# Patient Record
Sex: Female | Born: 1980 | Race: Black or African American | Hispanic: No | Marital: Single | Smoking: Never smoker
Health system: Southern US, Community
[De-identification: ages and names within clinical notes are randomized; demographics above are authoritative.]

## PROBLEM LIST (undated history)

## (undated) DIAGNOSIS — B009 Herpesviral infection, unspecified: Secondary | ICD-10-CM

## (undated) DIAGNOSIS — A599 Trichomoniasis, unspecified: Secondary | ICD-10-CM

## (undated) DIAGNOSIS — Z789 Other specified health status: Secondary | ICD-10-CM

## (undated) HISTORY — PX: DENTAL SURGERY: SHX609

---
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
## 2003-06-22 ENCOUNTER — Emergency Department (HOSPITAL_COMMUNITY): Admission: EM | Admit: 2003-06-22 | Discharge: 2003-06-22 | Payer: Self-pay | Admitting: Emergency Medicine

## 2003-10-02 ENCOUNTER — Inpatient Hospital Stay (HOSPITAL_COMMUNITY): Admission: AD | Admit: 2003-10-02 | Discharge: 2003-10-02 | Payer: Self-pay | Admitting: Obstetrics and Gynecology

## 2003-10-05 ENCOUNTER — Inpatient Hospital Stay (HOSPITAL_COMMUNITY): Admission: AD | Admit: 2003-10-05 | Discharge: 2003-10-05 | Payer: Self-pay | Admitting: Family Medicine

## 2003-12-29 ENCOUNTER — Inpatient Hospital Stay (HOSPITAL_COMMUNITY): Admission: AD | Admit: 2003-12-29 | Discharge: 2003-12-29 | Payer: Self-pay | Admitting: Obstetrics & Gynecology

## 2004-04-21 ENCOUNTER — Inpatient Hospital Stay (HOSPITAL_COMMUNITY): Admission: AD | Admit: 2004-04-21 | Discharge: 2004-04-24 | Payer: Self-pay | Admitting: Obstetrics

## 2005-02-23 ENCOUNTER — Emergency Department (HOSPITAL_COMMUNITY): Admission: EM | Admit: 2005-02-23 | Discharge: 2005-02-23 | Payer: Self-pay | Admitting: Family Medicine

## 2005-04-15 ENCOUNTER — Emergency Department (HOSPITAL_COMMUNITY): Admission: EM | Admit: 2005-04-15 | Discharge: 2005-04-15 | Payer: Self-pay | Admitting: Emergency Medicine

## 2005-07-07 ENCOUNTER — Inpatient Hospital Stay (HOSPITAL_COMMUNITY): Admission: AD | Admit: 2005-07-07 | Discharge: 2005-07-07 | Payer: Self-pay | Admitting: Obstetrics & Gynecology

## 2005-07-08 ENCOUNTER — Inpatient Hospital Stay (HOSPITAL_COMMUNITY): Admission: AD | Admit: 2005-07-08 | Discharge: 2005-07-08 | Payer: Self-pay | Admitting: Obstetrics & Gynecology

## 2006-07-01 IMAGING — CR DG ABDOMEN 1V
1 series · 1 of 1 positions shown · non-contrast
Comparison: none

CLINICAL DATA: Right sided abdominal pain for one day.  No vomiting or diarrhea.  
 ABDOMEN ? 1 VIEW:

[view not recorded]
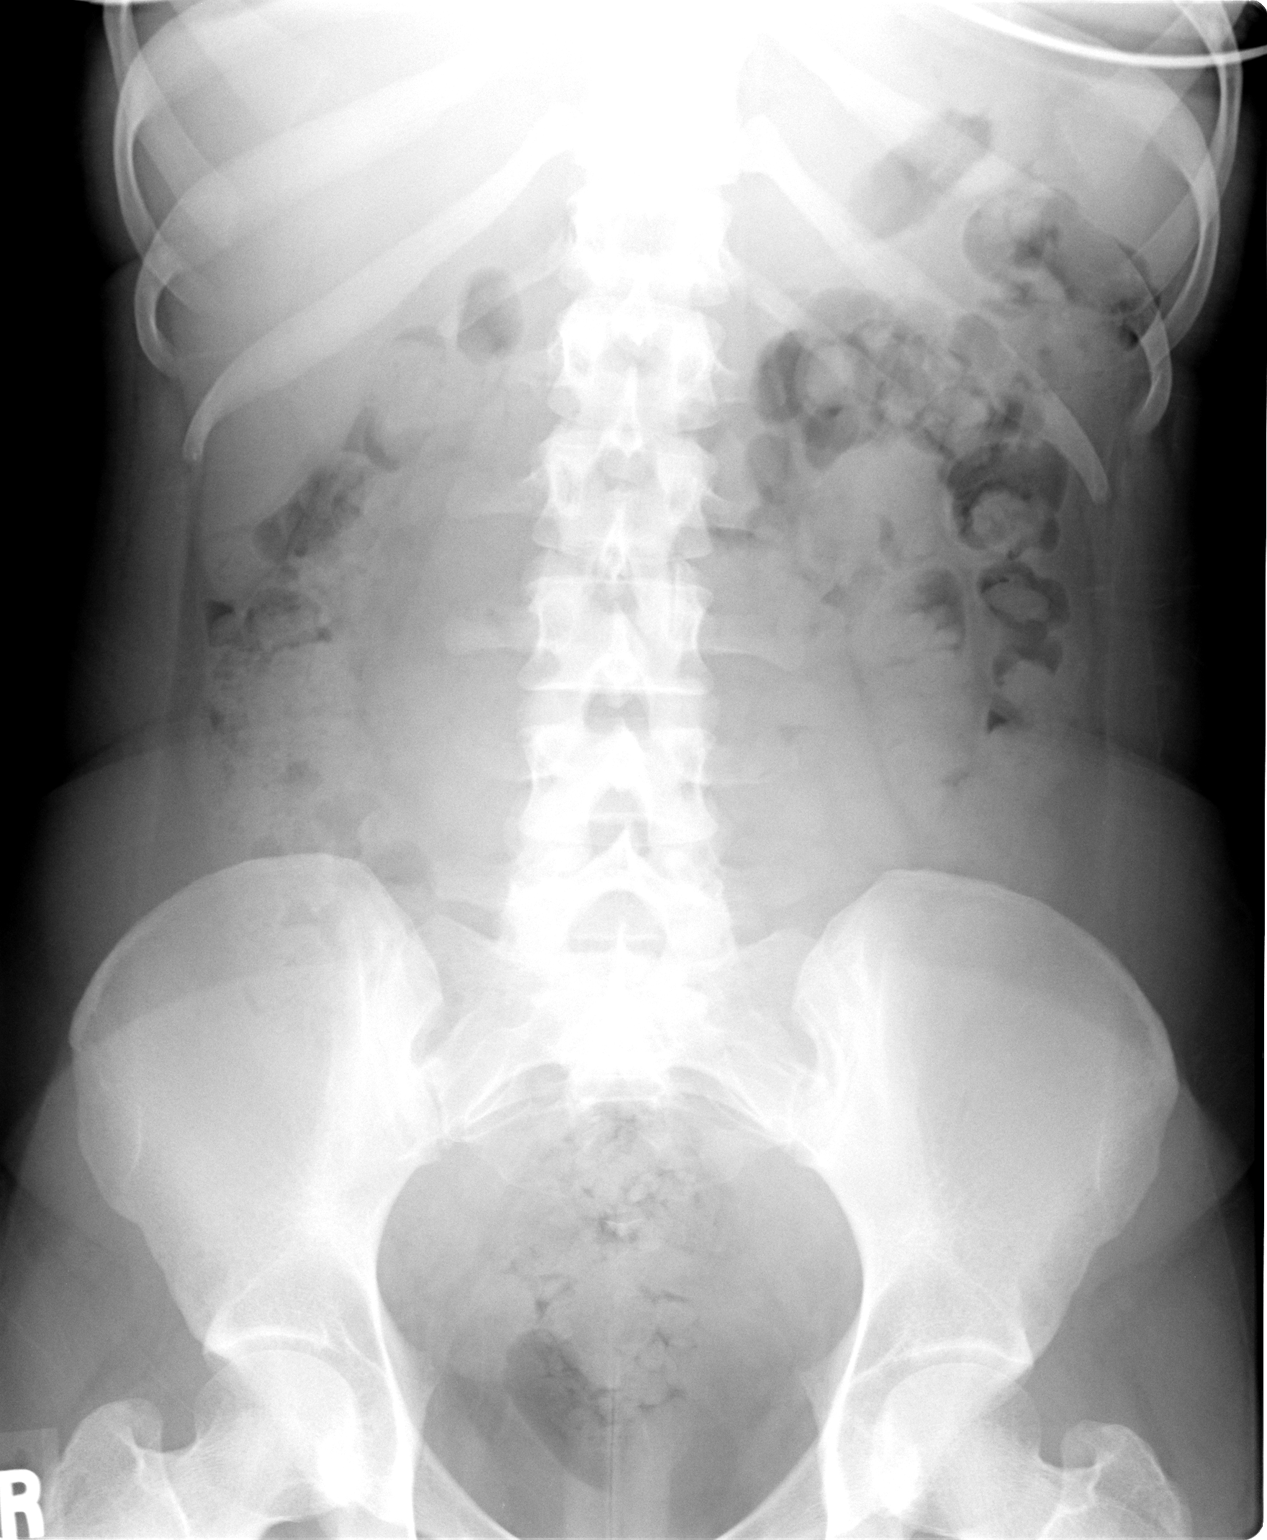

[1 of 1 positions shown; findings below may reference images not displayed]

FINDINGS: A single view of the abdomen shows a moderate amount of fecal material within the right and left colon.  There also appears to be a possible fecal impaction in the region of the rectum.  No evidence of obstruction of the bowel is seen.  No abnormal calcification or mass identified.  The bones of the pelvis and lumbar spine appear normal.
IMPRESSION: Moderate increase fecal material right and left colon.  Questionable fecal impaction of rectum.

## 2006-09-22 IMAGING — US US OB COMP LESS 14 WK
1 series · 14 of 28 positions shown · non-contrast
Comparison: none

CLINICAL DATA: Nausea and vomiting with vaginal bleeding.  
 OBSTETRICAL ULTRASOUND <14 WKS AND TRANSVAGINAL OB US:
TECHNIQUE: Both transabdominal and transvaginal ultrasound examinations were performed for complete evaluation of the gestation as well as the maternal uterus, adnexal regions, and pelvic cul-de-sac.

[Series 1: us ob comp less 14 wk · 0.22mm/px · 14 of 43 slices shown]
[im 2/43]
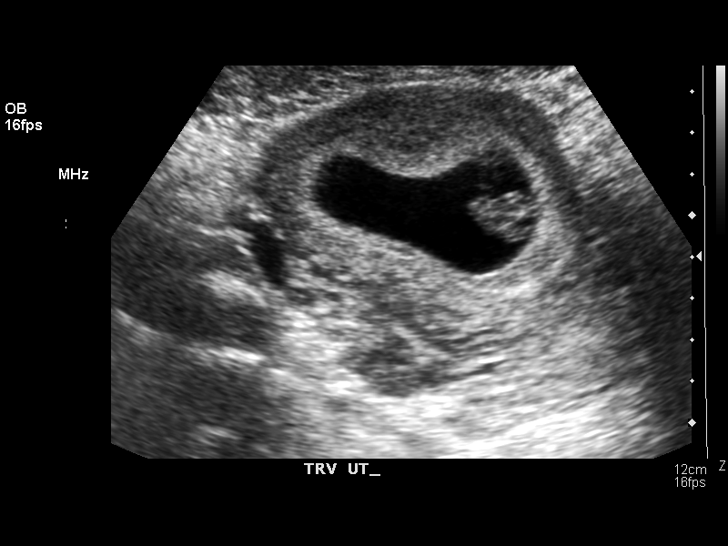
[im 5/43]
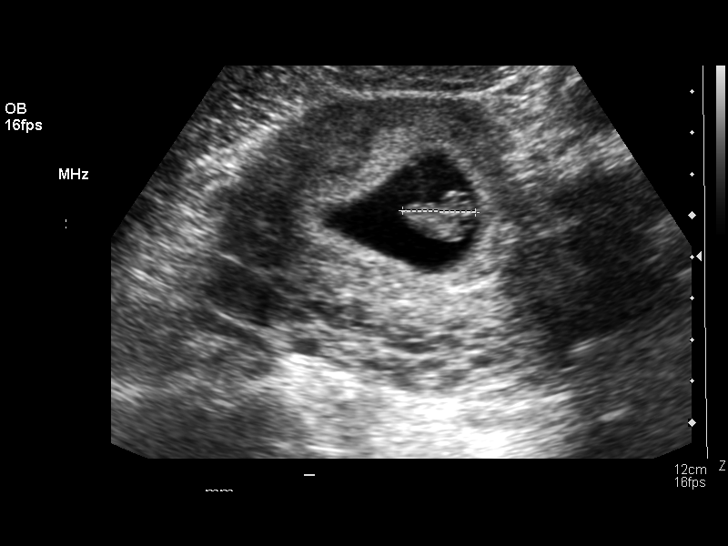
[im 8/43]
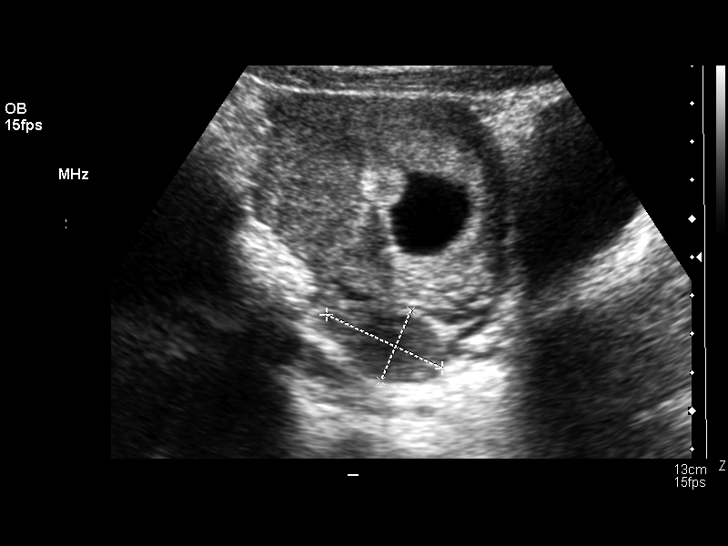
[im 11/43]
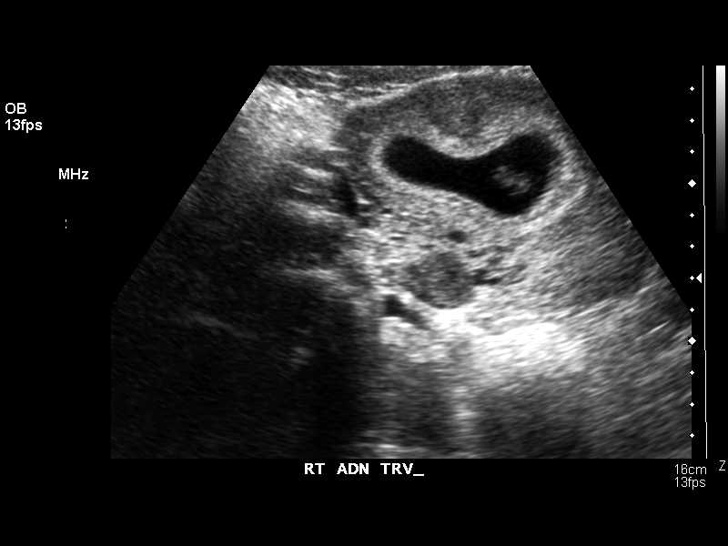
[im 15/43]
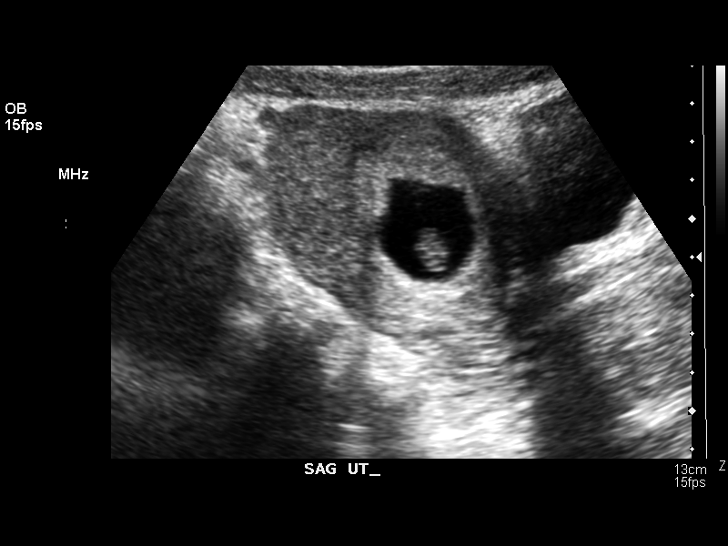
[im 18/43]
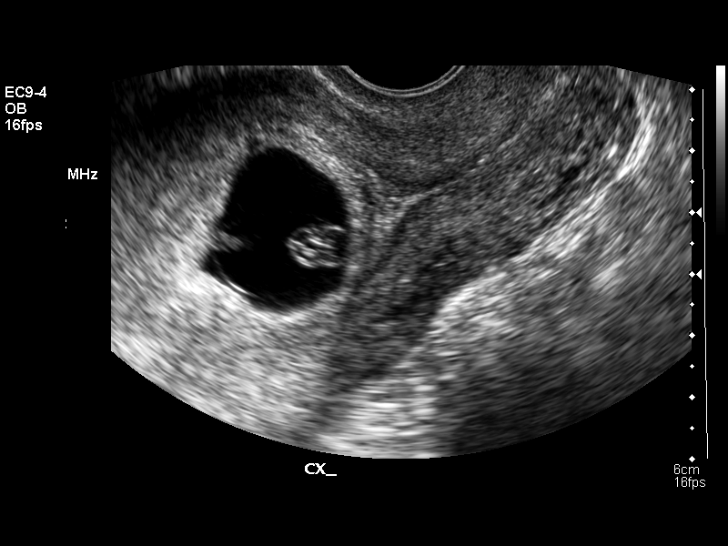
[im 21/43]
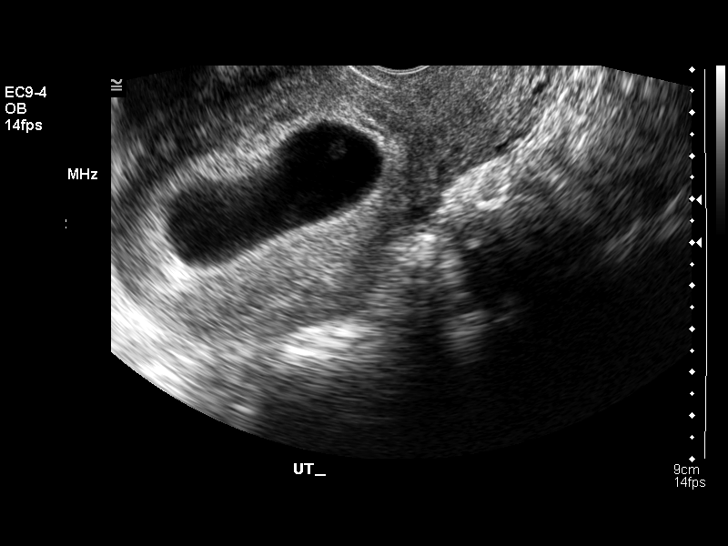
[im 24/43]
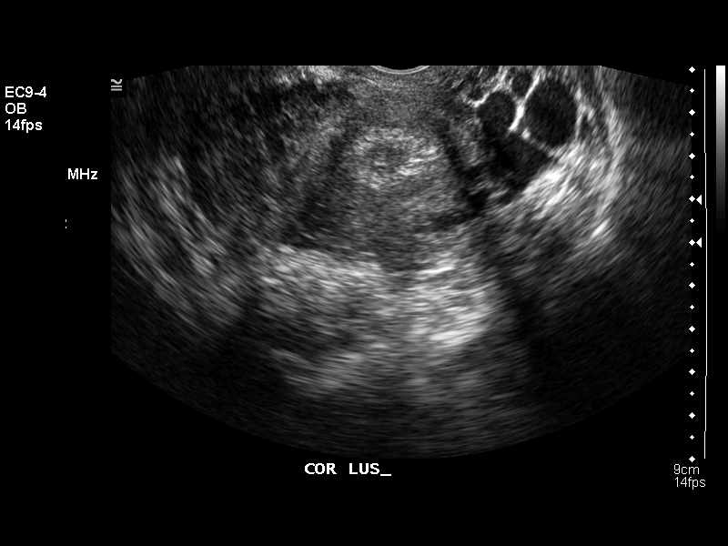
[im 27/43]
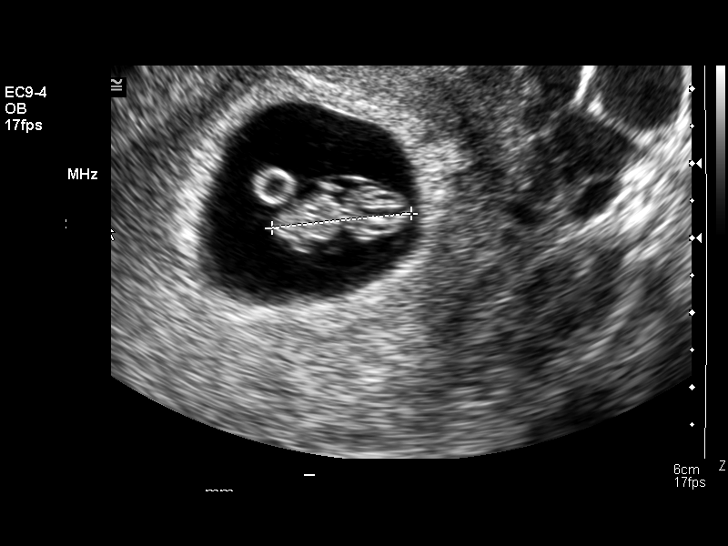
[im 30/43]
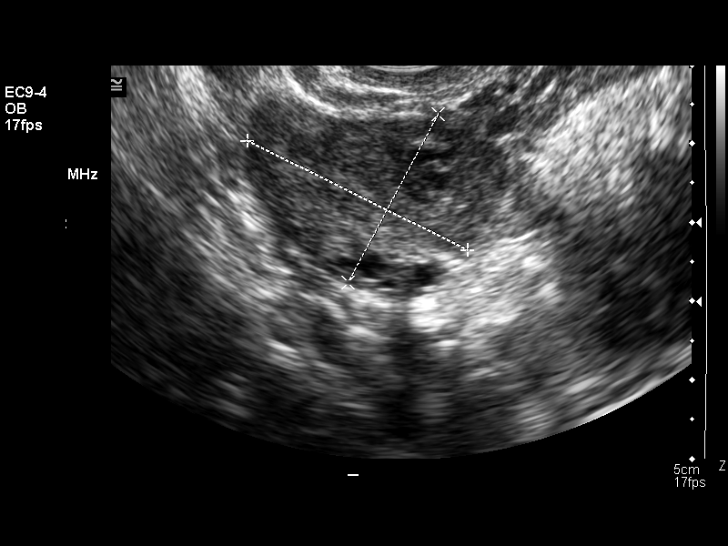
[im 33/43]
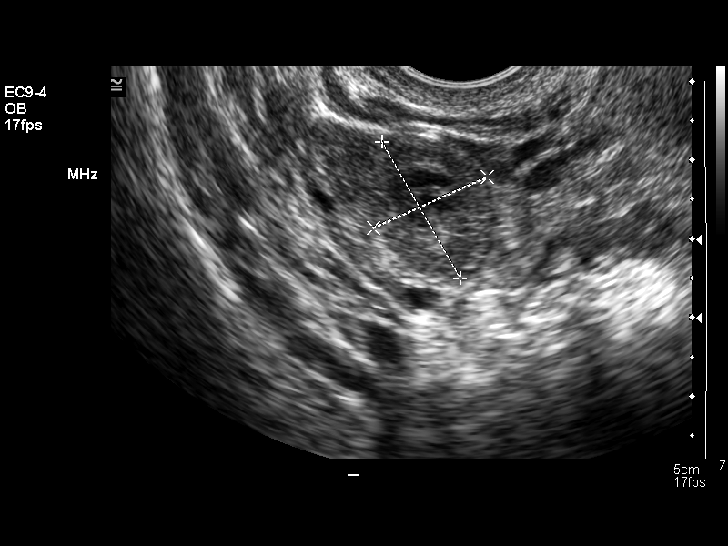
[im 36/43]
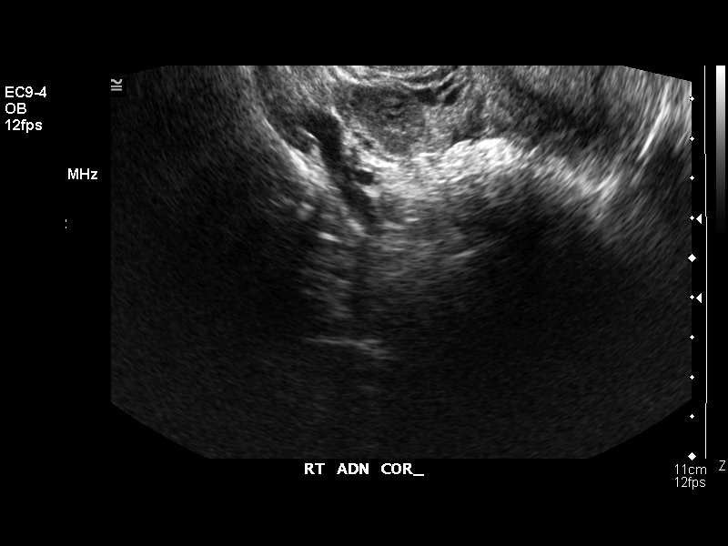
[im 39/43]
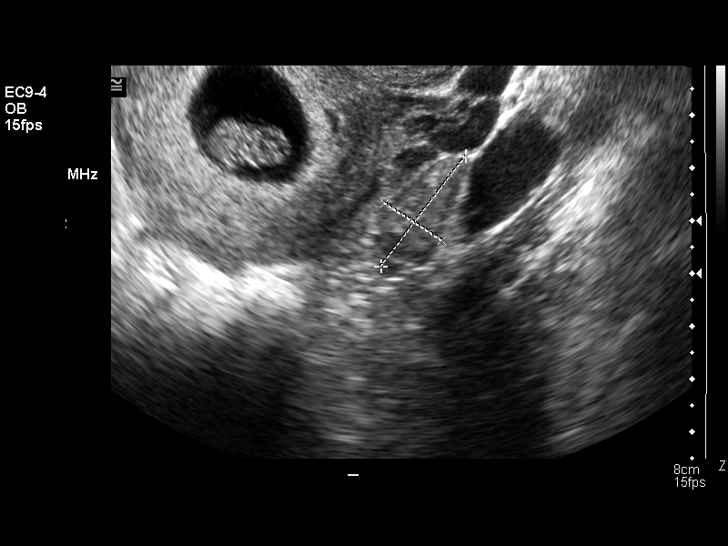
[im 43/43]
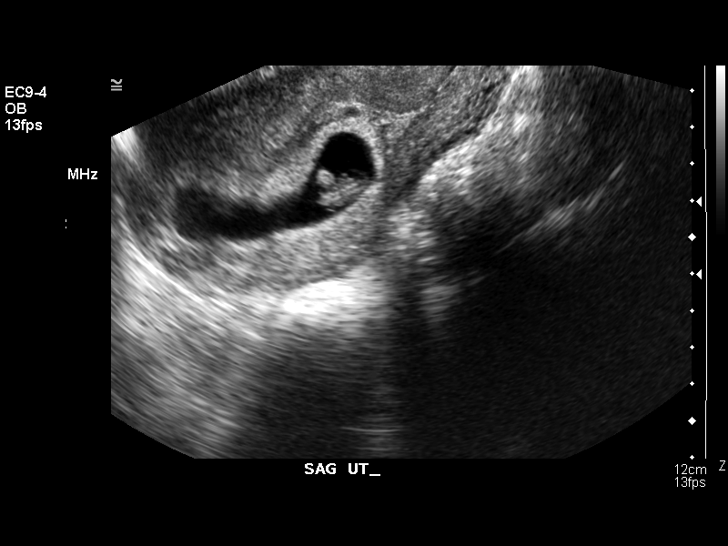

[14 of 28 positions shown; findings below may reference images not displayed]

FINDINGS: Multiple images of the uterus and adnexa were obtained using a transabdominal and endovaginal approaches.
 There is a single intrauterine pregnancy identified that demonstrates an estimated gestational age by crown rump length of 8 weeks and 3 days.  Positive regular fetal cardiac activity with a rate of 175 bpm was noted.  A normal appearing yolk sac and amnion are seen.  No signs of subchorionic hemorrhage are evident.
 The right ovary measures 3.1 x 2.4 x 2.4 cm and contains a corpus luteum cyst.  The left ovary measures 3.6 x 1.3 x 1.5 cm and has a normal appearance.
IMPRESSION: 8 week 3 day living intrauterine pregnancy.  Right corpus luteum cyst.

## 2007-04-05 ENCOUNTER — Inpatient Hospital Stay (HOSPITAL_COMMUNITY): Admission: AD | Admit: 2007-04-05 | Discharge: 2007-04-05 | Payer: Self-pay | Admitting: Obstetrics and Gynecology

## 2007-08-16 ENCOUNTER — Inpatient Hospital Stay (HOSPITAL_COMMUNITY): Admission: AD | Admit: 2007-08-16 | Discharge: 2007-08-16 | Payer: Self-pay | Admitting: Obstetrics and Gynecology

## 2010-08-21 ENCOUNTER — Inpatient Hospital Stay (HOSPITAL_COMMUNITY)
Admission: AD | Admit: 2010-08-21 | Discharge: 2010-08-21 | Disposition: A | Discharge status: Home / Self Care | Payer: 59 | Source: Ambulatory Visit | Attending: Obstetrics & Gynecology | Admitting: Obstetrics & Gynecology

## 2010-08-21 ENCOUNTER — Inpatient Hospital Stay (HOSPITAL_COMMUNITY): Payer: 59

## 2010-08-21 ENCOUNTER — Other Ambulatory Visit: Payer: Self-pay | Admitting: Obstetrics & Gynecology

## 2010-08-21 DIAGNOSIS — O039 Complete or unspecified spontaneous abortion without complication: Secondary | ICD-10-CM

## 2010-08-21 LAB — CBC
HCT: 35.9 % — ABNORMAL LOW (ref 36.0–46.0)
Hemoglobin: 11.8 g/dL — ABNORMAL LOW (ref 12.0–15.0)
MCH: 27.6 pg (ref 26.0–34.0)
MCHC: 32.9 g/dL (ref 30.0–36.0)
MCV: 84.1 fL (ref 78.0–100.0)
Platelets: 181 K/uL (ref 150–400)
RBC: 4.27 MIL/uL (ref 3.87–5.11)
RDW: 12.9 % (ref 11.5–15.5)
WBC: 4.8 K/uL (ref 4.0–10.5)

## 2010-08-21 LAB — HCG, QUANTITATIVE, PREGNANCY

## 2010-08-22 LAB — GC/CHLAMYDIA PROBE AMP, GENITAL
Chlamydia, DNA Probe: NEGATIVE
GC Probe Amp, Genital: NEGATIVE

## 2010-09-02 ENCOUNTER — Encounter: Payer: 59 | Admitting: Physician Assistant

## 2011-01-28 LAB — POCT PREGNANCY, URINE
Operator id: 113551
Preg Test, Ur: POSITIVE

## 2011-01-28 LAB — URINALYSIS, ROUTINE W REFLEX MICROSCOPIC
Ketones, ur: NEGATIVE
Nitrite: NEGATIVE
Protein, ur: NEGATIVE
Urobilinogen, UA: 0.2
pH: 6

## 2011-01-28 LAB — URINE MICROSCOPIC-ADD ON

## 2011-01-28 LAB — GC/CHLAMYDIA PROBE AMP, GENITAL: GC Probe Amp, Genital: NEGATIVE

## 2011-01-28 LAB — WET PREP, GENITAL: Trich, Wet Prep: NONE SEEN

## 2011-01-28 LAB — HEMOGLOBIN AND HEMATOCRIT, BLOOD: HCT: 35.9 — ABNORMAL LOW

## 2011-02-10 LAB — CBC
HCT: 37.9
MCV: 85.6
Platelets: 208
WBC: 6.8

## 2011-02-10 LAB — WET PREP, GENITAL

## 2011-02-10 LAB — GC/CHLAMYDIA PROBE AMP, GENITAL: GC Probe Amp, Genital: NEGATIVE

## 2011-11-06 IMAGING — US US OB COMP LESS 14 WK
1 series · 14 of 28 positions shown · non-contrast
Comparison: none

[Series 1: us ob comp less 14 wks · 14 of 48 slices shown]
[im 2/48]
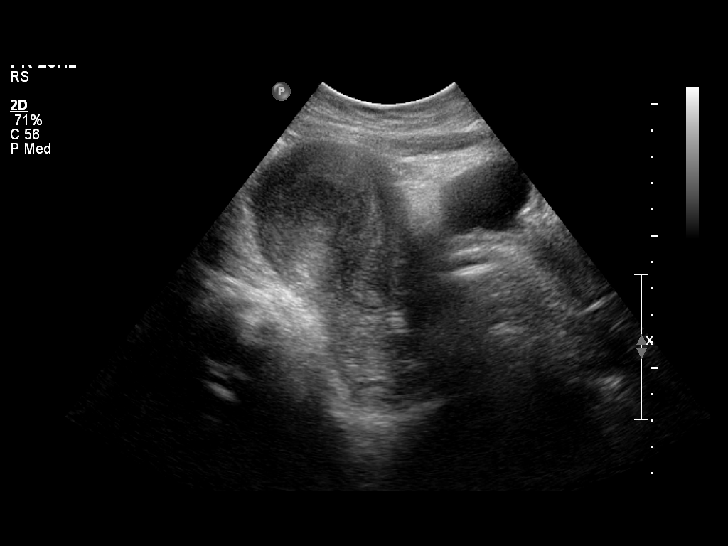
[im 6/48]
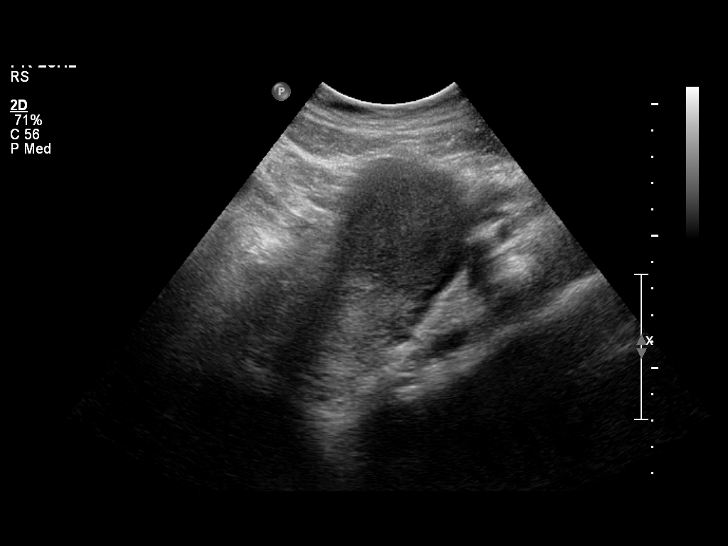
[im 9/48]
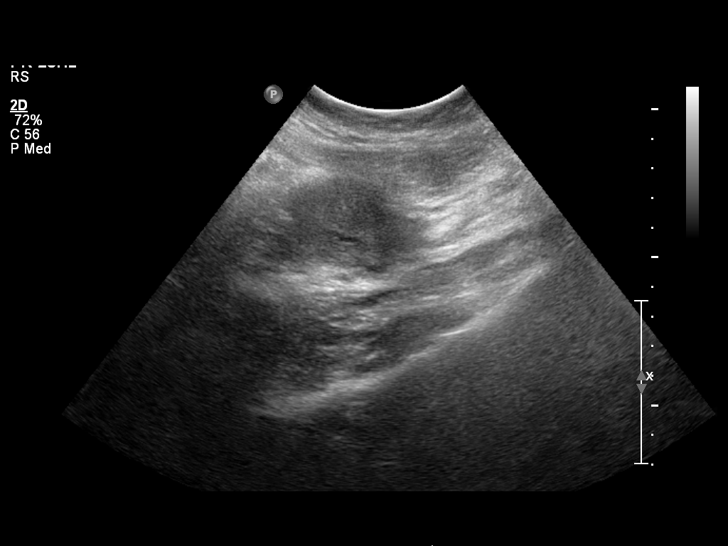
[im 13/48]
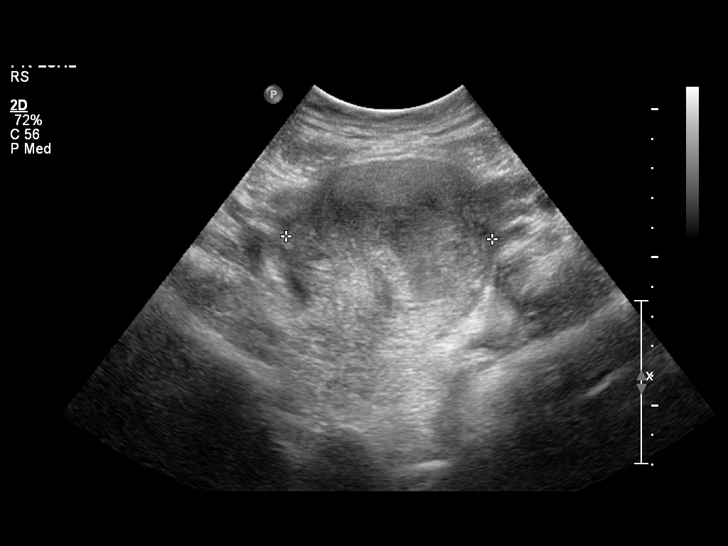
[im 16/48]
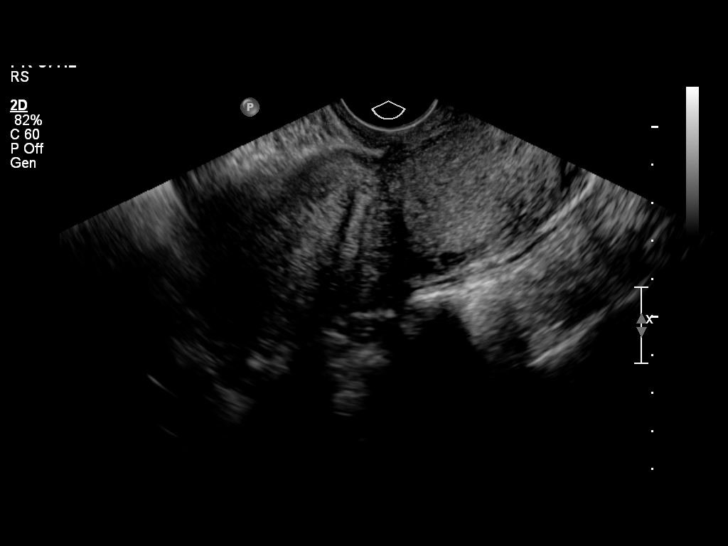
[im 20/48]
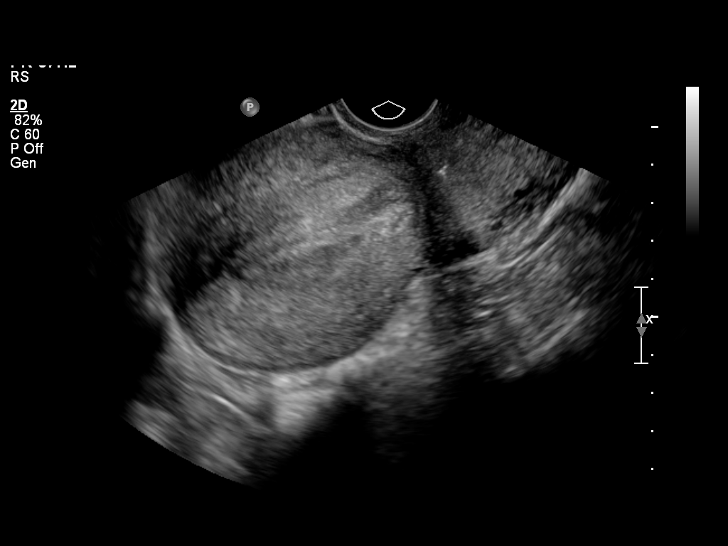
[im 23/48]
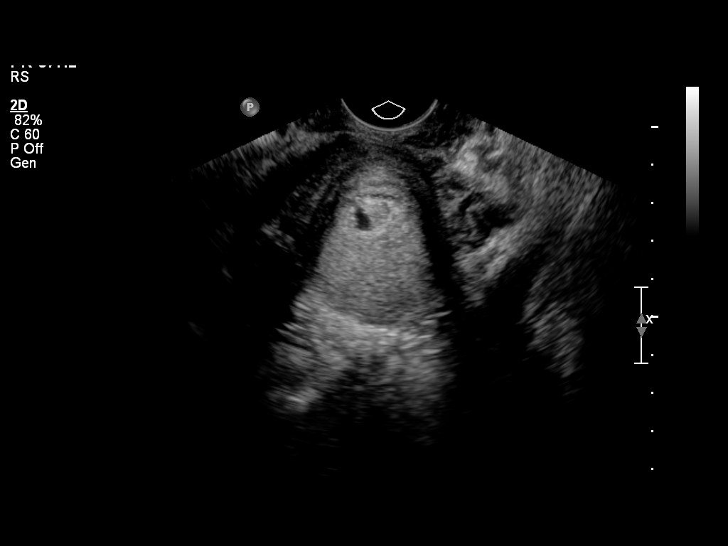
[im 27/48]
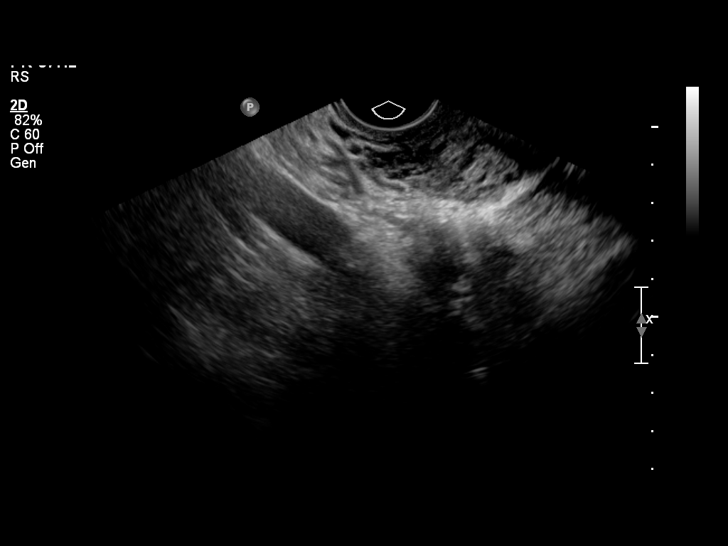
[im 30/48]
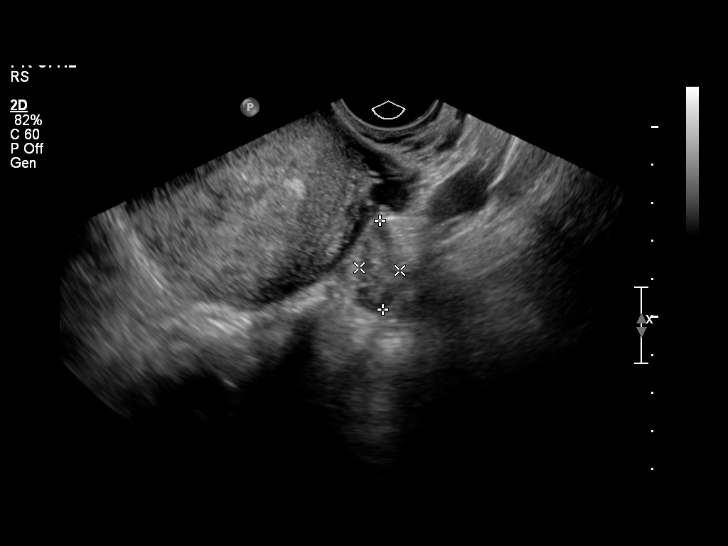
[im 34/48]
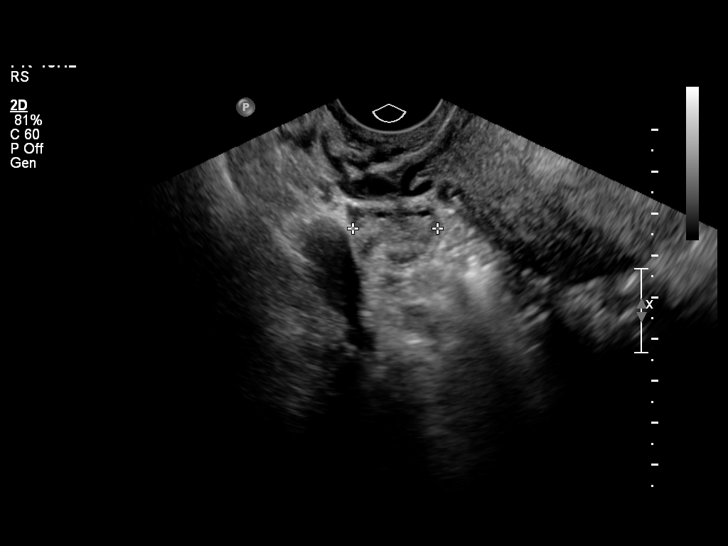
[im 37/48]
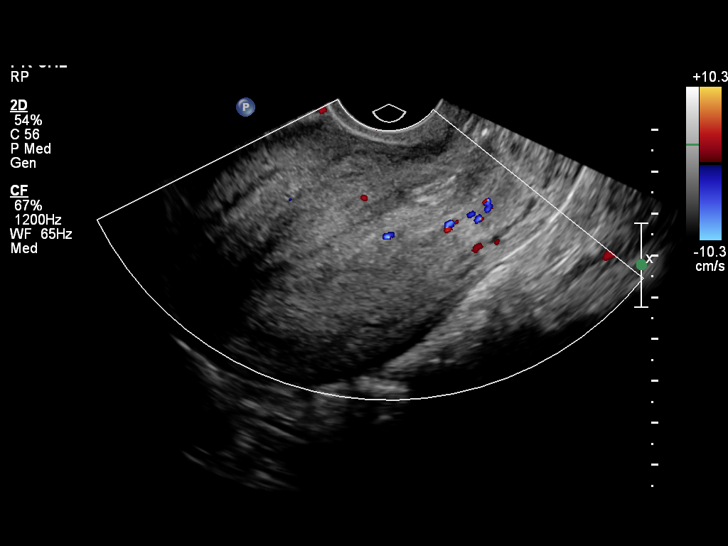
[im 41/48]
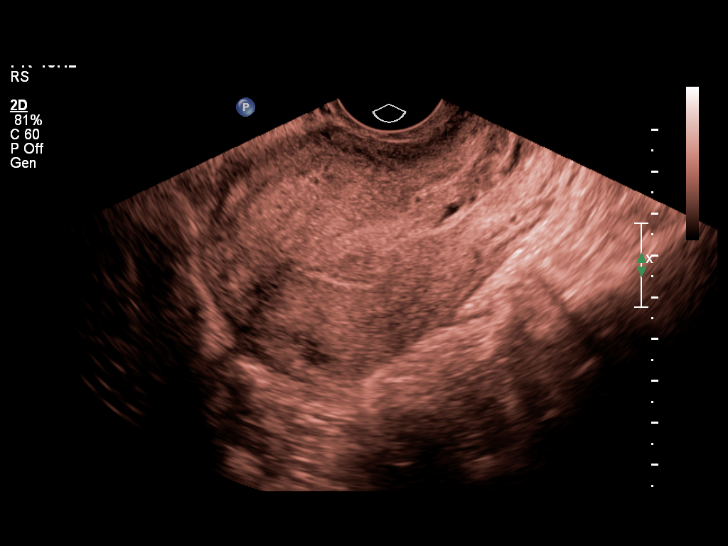
[im 44/48]
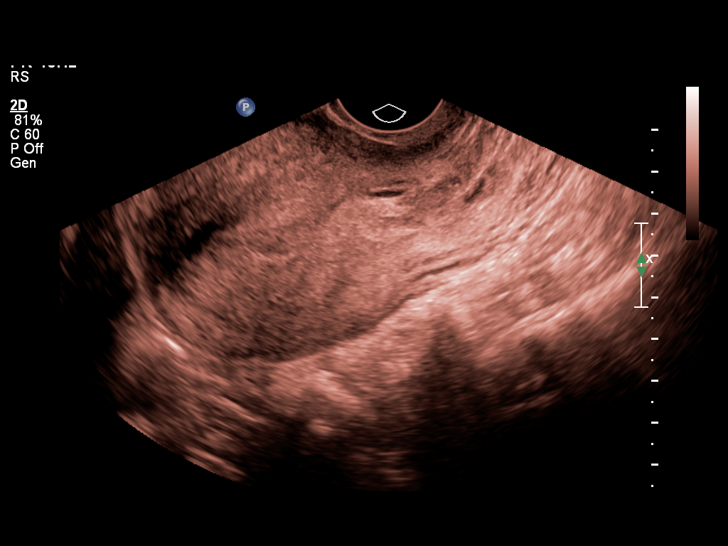
[im 48/48]
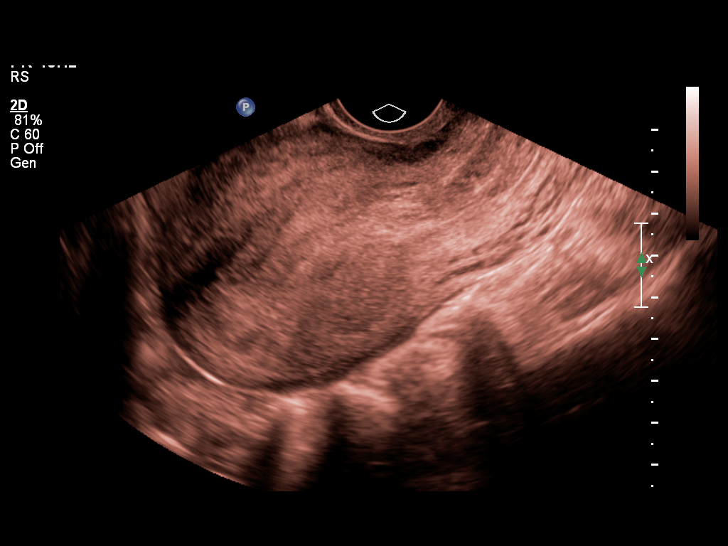

[14 of 28 positions shown; findings below may reference images not displayed]

OBSTETRICS REPORT
                      (Signed Final 08/21/2010 [DATE])

                 28_E
Procedures

 US OB COMP LESS 14 WKS                                76801.0
 US OB TRANSVAGINAL                                    76817.0
Indications

 Vaginal bleeding, unknown etiology
Fetal Evaluation

 Gest. Sac:         None seen
 Yolk Sac:          Not visualized
 Fetal Pole:        Not visualized
 Cardiac Activity:  No embryo visualized
Gestational Age

 LMP:           9w 1d         Date:  06/18/10                 EDD:   03/25/11
 Best:          9w 1d      Det. By:  LMP  (06/18/10)          EDD:   03/25/11
Cervix Uterus Adnexa

 Uterus:       Heterogeneous endometrium measuring 14mm with
               small amount of fluid noted.

 Comment:    Endometrium measures 13.6 mm
Impression

 No IUP or adnexal mass identified.  DDx includes recent
 SAB,  IUP too early to visualize, or occult ectopic pregnancy.
 Recommend close f/u of quantitative BHCG levels, and
 followup US as clinically warranted.

## 2012-04-09 ENCOUNTER — Inpatient Hospital Stay (HOSPITAL_COMMUNITY)
Admission: AD | Admit: 2012-04-09 | Discharge: 2012-04-09 | Disposition: A | Discharge status: Home / Self Care | Payer: Medicaid Other | Source: Ambulatory Visit | Attending: Obstetrics & Gynecology | Admitting: Obstetrics & Gynecology

## 2012-04-09 ENCOUNTER — Encounter (HOSPITAL_COMMUNITY): Payer: Self-pay | Admitting: *Deleted

## 2012-04-09 DIAGNOSIS — B9689 Other specified bacterial agents as the cause of diseases classified elsewhere: Secondary | ICD-10-CM | POA: Insufficient documentation

## 2012-04-09 DIAGNOSIS — L293 Anogenital pruritus, unspecified: Secondary | ICD-10-CM | POA: Insufficient documentation

## 2012-04-09 DIAGNOSIS — A499 Bacterial infection, unspecified: Secondary | ICD-10-CM

## 2012-04-09 DIAGNOSIS — N949 Unspecified condition associated with female genital organs and menstrual cycle: Secondary | ICD-10-CM | POA: Insufficient documentation

## 2012-04-09 DIAGNOSIS — L259 Unspecified contact dermatitis, unspecified cause: Secondary | ICD-10-CM | POA: Insufficient documentation

## 2012-04-09 DIAGNOSIS — N760 Acute vaginitis: Secondary | ICD-10-CM | POA: Insufficient documentation

## 2012-04-09 HISTORY — DX: Trichomoniasis, unspecified: A59.9

## 2012-04-09 LAB — WET PREP, GENITAL: Yeast Wet Prep HPF POC: NONE SEEN

## 2012-04-09 LAB — POCT PREGNANCY, URINE: Preg Test, Ur: NEGATIVE

## 2012-04-09 MED ORDER — METRONIDAZOLE 500 MG PO TABS: 500.0000 mg | ORAL_TABLET | Freq: Two times a day (BID) | ORAL | Status: DC

## 2012-04-09 NOTE — MAU Provider Note (Signed)
  History     CSN: [id number]  Arrival date and time: 04/09/12 1649   First Provider Initiated Contact with Patient 04/09/12 2022      Chief Complaint  Patient presents with  . Vaginal Discharge   HPI Patient states she has had a greenish/yellow vaginal discharge with an odor and itching for several weeks. Has also been having a body rash that she is using hydrocortisone cream but not getting better.     Past Medical History  Diagnosis Date  . Trichimoniasis     Past Surgical History  Procedure Date  . Vaginal delivery   . Dental surgery     History reviewed. No pertinent family history.  History  Substance Use Topics  . Smoking status: Never Smoker   . Smokeless tobacco: Not on file  . Alcohol Use: No    Allergies: No Known Allergies  No prescriptions prior to admission    Review of Systems  Genitourinary:       Vaginal discharge and odor  All other systems reviewed and are negative.   Physical Exam   Blood pressure 127/81, pulse 85, temperature 99.7 F (37.6 C), temperature source Oral, resp. rate 18, height 5\' 7"  (1.702 m), weight 101.696 kg (224 lb 3.2 oz), last menstrual period 03/22/2012, SpO2 100.00%.  Physical Exam  Constitutional: She is oriented to person, place, and time. She appears well-developed and well-nourished. No distress.  HENT:  Head: Normocephalic.  Neck: Normal range of motion. Neck supple.  Cardiovascular: Normal rate and regular rhythm.   Respiratory: Effort normal and breath sounds normal.  GI: Soft. She exhibits mass. There is no tenderness.  Genitourinary: Vaginal discharge (white, creamy) found.       +fishy odor  Neurological: She is alert and oriented to person, place, and time. She has normal reflexes.  Skin: Skin is warm and dry.       Reddened areas on left upper arm; no prominent lesions.    MAU Course  Procedures Results for orders placed during the hospital encounter of 04/09/12 (from the past 24 hour(s))  POCT  PREGNANCY, URINE     Status: Normal   Collection Time   04/09/12  7:04 PM      Component Value Range   Preg Test, Ur NEGATIVE  NEGATIVE  WET PREP, GENITAL     Status: Abnormal   Collection Time   04/09/12  8:30 PM      Component Value Range   Yeast Wet Prep HPF POC NONE SEEN  NONE SEEN   Trich, Wet Prep NONE SEEN  NONE SEEN   Clue Cells Wet Prep HPF POC FEW (*) NONE SEEN   WBC, Wet Prep HPF POC MANY (*) NONE SEEN     Assessment and Plan  Bacterial Vaginosis Contact Dermatitis  Plan: DC to home RX Flagyl Use OTC benadryl cream Consider eval if no improvement    [facility] 04/09/2012, 8:23 PM

## 2012-04-09 NOTE — MAU Note (Signed)
Patient states she has had a greenish/yellow vaginal discharge with an odor and itching for several weeks. Has also been having a body rash that she is using hydrocortisone cream but not getting better.

## 2012-04-12 NOTE — MAU Provider Note (Signed)
Attestation of Attending Supervision of Advanced Practitioner (CNM/NP): Evaluation and management procedures were performed by the Advanced Practitioner under my supervision and collaboration.  I have reviewed the Advanced Practitioner's note and chart, and I agree with the management and plan.  Evia Goldsmith, MD, FACOG Attending Obstetrician & Gynecologist Faculty Practice, [facility]  

## 2013-04-22 LAB — OB RESULTS CONSOLE RUBELLA ANTIBODY, IGM: RUBELLA: IMMUNE

## 2013-04-22 LAB — OB RESULTS CONSOLE HIV ANTIBODY (ROUTINE TESTING): HIV: NONREACTIVE

## 2013-04-22 LAB — OB RESULTS CONSOLE ANTIBODY SCREEN: Antibody Screen: NEGATIVE

## 2013-04-22 LAB — OB RESULTS CONSOLE HEPATITIS B SURFACE ANTIGEN: Hepatitis B Surface Ag: NEGATIVE

## 2013-04-22 LAB — OB RESULTS CONSOLE RPR: RPR: NONREACTIVE

## 2013-05-05 NOTE — L&D Delivery Note (Signed)
Delivery Note At 2:21 PM a viable female was delivered via Vaginal, Spontaneous Delivery (Presentation: Right Occiput Anterior).  APGAR: 8, 9; weight 6 lb 6.8 oz (2915 g).   Placenta status: Intact, Spontaneous.  Cord: 3 vessels with the following complications: None.  Cord pH: NA  Anesthesia: Epidural  Episiotomy: None Lacerations: None Suture Repair: NA Est. Blood Loss (mL): 400  Mom to postpartum.  Baby to Couplet care / Skin to Skin.  Ameria Sanjurjo J. 11/21/2013, 7:01 PM

## 2013-06-03 ENCOUNTER — Other Ambulatory Visit (HOSPITAL_COMMUNITY)
Admission: RE | Admit: 2013-06-03 | Discharge: 2013-06-03 | Disposition: A | Discharge status: Home / Self Care | Payer: BC Managed Care – PPO | Source: Ambulatory Visit | Attending: Obstetrics & Gynecology | Admitting: Obstetrics & Gynecology

## 2013-06-03 ENCOUNTER — Other Ambulatory Visit: Payer: Self-pay | Admitting: Obstetrics & Gynecology

## 2013-06-03 DIAGNOSIS — Z113 Encounter for screening for infections with a predominantly sexual mode of transmission: Secondary | ICD-10-CM | POA: Insufficient documentation

## 2013-06-03 DIAGNOSIS — N760 Acute vaginitis: Secondary | ICD-10-CM | POA: Insufficient documentation

## 2013-06-03 DIAGNOSIS — Z01419 Encounter for gynecological examination (general) (routine) without abnormal findings: Secondary | ICD-10-CM | POA: Insufficient documentation

## 2013-06-03 DIAGNOSIS — Z1151 Encounter for screening for human papillomavirus (HPV): Secondary | ICD-10-CM | POA: Insufficient documentation

## 2013-09-14 LAB — OB RESULTS CONSOLE HIV ANTIBODY (ROUTINE TESTING): HIV: NONREACTIVE

## 2013-10-28 LAB — OB RESULTS CONSOLE GBS: GBS: POSITIVE

## 2013-11-21 ENCOUNTER — Encounter (HOSPITAL_COMMUNITY): Payer: Self-pay | Admitting: *Deleted

## 2013-11-21 ENCOUNTER — Encounter (HOSPITAL_COMMUNITY): Payer: BC Managed Care – PPO | Admitting: Anesthesiology

## 2013-11-21 ENCOUNTER — Inpatient Hospital Stay (HOSPITAL_COMMUNITY)
Admission: AD | Admit: 2013-11-21 | Discharge: 2013-11-23 | DRG: 774 | Disposition: A | Discharge status: Home / Self Care | Payer: BC Managed Care – PPO | Source: Ambulatory Visit | Attending: Obstetrics and Gynecology | Admitting: Obstetrics and Gynecology

## 2013-11-21 ENCOUNTER — Inpatient Hospital Stay (HOSPITAL_COMMUNITY): Payer: BC Managed Care – PPO | Admitting: Anesthesiology

## 2013-11-21 DIAGNOSIS — O9989 Other specified diseases and conditions complicating pregnancy, childbirth and the puerperium: Principal | ICD-10-CM

## 2013-11-21 DIAGNOSIS — O98519 Other viral diseases complicating pregnancy, unspecified trimester: Secondary | ICD-10-CM | POA: Diagnosis present

## 2013-11-21 DIAGNOSIS — Z22330 Carrier of Group B streptococcus: Secondary | ICD-10-CM

## 2013-11-21 DIAGNOSIS — A6000 Herpesviral infection of urogenital system, unspecified: Secondary | ICD-10-CM | POA: Diagnosis present

## 2013-11-21 DIAGNOSIS — O99892 Other specified diseases and conditions complicating childbirth: Principal | ICD-10-CM | POA: Diagnosis present

## 2013-11-21 DIAGNOSIS — IMO0001 Reserved for inherently not codable concepts without codable children: Secondary | ICD-10-CM

## 2013-11-21 HISTORY — DX: Other specified health status: Z78.9

## 2013-11-21 HISTORY — DX: Herpesviral infection, unspecified: B00.9

## 2013-11-21 LAB — COMPREHENSIVE METABOLIC PANEL
ALK PHOS: 87 U/L (ref 39–117)
ALT: 6 U/L (ref 0–35)
AST: 10 U/L (ref 0–37)
Albumin: 2.2 g/dL — ABNORMAL LOW (ref 3.5–5.2)
Anion gap: 12 (ref 5–15)
BUN: 9 mg/dL (ref 6–23)
CHLORIDE: 101 meq/L (ref 96–112)
CO2: 22 meq/L (ref 19–32)
Calcium: 8.7 mg/dL (ref 8.4–10.5)
Creatinine, Ser: 0.65 mg/dL (ref 0.50–1.10)
GFR calc Af Amer: >90 mL/min (ref >90)
GFR calc non Af Amer: >90 mL/min (ref >90)
GLUCOSE: 129 mg/dL — AB (ref 70–99)
POTASSIUM: 3.6 meq/L — AB (ref 3.7–5.3)
SODIUM: 135 meq/L — AB (ref 137–147)
TOTAL PROTEIN: 5.6 g/dL — AB (ref 6.0–8.3)
Total Bilirubin: <0.2 mg/dL — ABNORMAL LOW (ref 0.3–1.2)

## 2013-11-21 LAB — CBC
HCT: 29.4 % — ABNORMAL LOW (ref 36.0–46.0)
HEMATOCRIT: 32.6 % — AB (ref 36.0–46.0)
HEMOGLOBIN: 10.7 g/dL — AB (ref 12.0–15.0)
HEMOGLOBIN: 9.7 g/dL — AB (ref 12.0–15.0)
MCH: 26.4 pg (ref 26.0–34.0)
MCH: 26.6 pg (ref 26.0–34.0)
MCHC: 32.8 g/dL (ref 30.0–36.0)
MCHC: 33 g/dL (ref 30.0–36.0)
MCV: 80.3 fL (ref 78.0–100.0)
MCV: 80.5 fL (ref 78.0–100.0)
Platelets: 151 10*3/uL (ref 150–400)
Platelets: 133 10*3/uL — ABNORMAL LOW (ref 150–400)
RBC: 4.06 MIL/uL (ref 3.87–5.11)
RBC: 3.65 MIL/uL — AB (ref 3.87–5.11)
RDW: 13.3 % (ref 11.5–15.5)
RDW: 13.2 % (ref 11.5–15.5)
WBC: 7.1 10*3/uL (ref 4.0–10.5)
WBC: 11.8 10*3/uL — ABNORMAL HIGH (ref 4.0–10.5)

## 2013-11-21 LAB — RPR

## 2013-11-21 LAB — URIC ACID: Uric Acid, Serum: 5.4 mg/dL (ref 2.4–7.0)

## 2013-11-21 LAB — TYPE AND SCREEN
ABO/RH(D): B POS
Antibody Screen: NEGATIVE

## 2013-11-21 MED ORDER — DIPHENHYDRAMINE HCL 25 MG PO CAPS: 25.0000 mg | ORAL_CAPSULE | Freq: Four times a day (QID) | ORAL | Status: DC | PRN

## 2013-11-21 MED ORDER — LACTATED RINGERS IV SOLN
500.0000 mL | Freq: Once | INTRAVENOUS | Status: AC
  Administered 2013-11-21: 1000 mL via INTRAVENOUS

## 2013-11-21 MED ORDER — ONDANSETRON HCL 4 MG/2ML IJ SOLN: 4.0000 mg | INTRAMUSCULAR | Status: DC | PRN

## 2013-11-21 MED ORDER — FENTANYL 2.5 MCG/ML BUPIVACAINE 1/10 % EPIDURAL INFUSION (WH - ANES): 14.0000 mL/h | INTRAMUSCULAR | Status: DC | PRN

## 2013-11-21 MED ORDER — LIDOCAINE HCL (PF) 1 % IJ SOLN
INTRAMUSCULAR | Status: DC | PRN
  Administered 2013-11-21: 10 mL

## 2013-11-21 MED ORDER — IBUPROFEN 600 MG PO TABS
600.0000 mg | ORAL_TABLET | Freq: Four times a day (QID) | ORAL | Status: DC
  Administered 2013-11-21: 600 mg via ORAL

## 2013-11-21 MED ORDER — EPHEDRINE 5 MG/ML INJ
10.0000 mg | INTRAVENOUS | Status: DC | PRN
  Filled 2013-11-21: qty 2

## 2013-11-21 MED ORDER — PHENYLEPHRINE 40 MCG/ML (10ML) SYRINGE FOR IV PUSH (FOR BLOOD PRESSURE SUPPORT)
80.0000 ug | PREFILLED_SYRINGE | INTRAVENOUS | Status: DC | PRN
  Filled 2013-11-21: qty 2
  Filled 2013-11-21: qty 10

## 2013-11-21 MED ORDER — ONDANSETRON HCL 4 MG/2ML IJ SOLN
4.0000 mg | Freq: Four times a day (QID) | INTRAMUSCULAR | Status: DC | PRN
Start: 2013-11-21 — End: 2013-11-21

## 2013-11-21 MED ORDER — LACTATED RINGERS IV SOLN: INTRAVENOUS | Status: DC

## 2013-11-21 MED ORDER — FENTANYL 2.5 MCG/ML BUPIVACAINE 1/10 % EPIDURAL INFUSION (WH - ANES)
14.0000 mL/h | INTRAMUSCULAR | Status: DC | PRN
  Administered 2013-11-21: 14 mL/h via EPIDURAL
  Filled 2013-11-21: qty 125

## 2013-11-21 MED ORDER — LACTATED RINGERS IV SOLN: 500.0000 mL | INTRAVENOUS | Status: DC | PRN

## 2013-11-21 MED ORDER — WITCH HAZEL-GLYCERIN EX PADS
1.0000 | MEDICATED_PAD | CUTANEOUS | Status: DC | PRN
Start: 2013-11-21 — End: 2013-11-23

## 2013-11-21 MED ORDER — PHENYLEPHRINE 40 MCG/ML (10ML) SYRINGE FOR IV PUSH (FOR BLOOD PRESSURE SUPPORT)
80.0000 ug | PREFILLED_SYRINGE | INTRAVENOUS | Status: DC | PRN
  Filled 2013-11-21: qty 2

## 2013-11-21 MED ORDER — FERROUS SULFATE 325 (65 FE) MG PO TABS
325.0000 mg | ORAL_TABLET | Freq: Two times a day (BID) | ORAL | Status: DC
  Administered 2013-11-22 – 2013-11-23 (×3): 325 mg via ORAL
  Filled 2013-11-21 (×3): qty 1

## 2013-11-21 MED ORDER — LANOLIN HYDROUS EX OINT
TOPICAL_OINTMENT | CUTANEOUS | Status: DC | PRN
Start: 2013-11-21 — End: 2013-11-23

## 2013-11-21 MED ORDER — OXYCODONE-ACETAMINOPHEN 5-325 MG PO TABS
1.0000 | ORAL_TABLET | ORAL | Status: DC | PRN
  Administered 2013-11-21: 1 via ORAL
  Filled 2013-11-21: qty 1

## 2013-11-21 MED ORDER — DIBUCAINE 1 % RE OINT: 1.0000 "application " | TOPICAL_OINTMENT | RECTAL | Status: DC | PRN

## 2013-11-21 MED ORDER — TERBUTALINE SULFATE 1 MG/ML IJ SOLN: 0.2500 mg | Freq: Once | INTRAMUSCULAR | Status: DC | PRN

## 2013-11-21 MED ORDER — DIPHENHYDRAMINE HCL 50 MG/ML IJ SOLN: 12.5000 mg | INTRAMUSCULAR | Status: DC | PRN

## 2013-11-21 MED ORDER — IBUPROFEN 600 MG PO TABS
600.0000 mg | ORAL_TABLET | Freq: Four times a day (QID) | ORAL | Status: DC
  Administered 2013-11-21 – 2013-11-23 (×7): 600 mg via ORAL
  Filled 2013-11-21 (×7): qty 1

## 2013-11-21 MED ORDER — CITRIC ACID-SODIUM CITRATE 334-500 MG/5ML PO SOLN: 30.0000 mL | ORAL | Status: DC | PRN

## 2013-11-21 MED ORDER — FLEET ENEMA 7-19 GM/118ML RE ENEM: 1.0000 | ENEMA | RECTAL | Status: DC | PRN

## 2013-11-21 MED ORDER — LIDOCAINE HCL (PF) 1 % IJ SOLN
30.0000 mL | INTRAMUSCULAR | Status: DC | PRN
  Filled 2013-11-21: qty 30

## 2013-11-21 MED ORDER — PENICILLIN G POTASSIUM 5000000 UNITS IJ SOLR
5.0000 10*6.[IU] | Freq: Once | INTRAMUSCULAR | Status: AC
  Administered 2013-11-21: 5 10*6.[IU] via INTRAVENOUS
  Filled 2013-11-21: qty 5

## 2013-11-21 MED ORDER — ACETAMINOPHEN 325 MG PO TABS: 650.0000 mg | ORAL_TABLET | ORAL | Status: DC | PRN

## 2013-11-21 MED ORDER — LACTATED RINGERS IV BOLUS (SEPSIS)
1000.0000 mL | Freq: Once | INTRAVENOUS | Status: AC
  Administered 2013-11-21 (×2): 1000 mL via INTRAVENOUS

## 2013-11-21 MED ORDER — PENICILLIN G POTASSIUM 5000000 UNITS IJ SOLR
2.5000 10*6.[IU] | INTRAVENOUS | Status: DC
  Filled 2013-11-21 (×4): qty 2.5

## 2013-11-21 MED ORDER — OXYTOCIN 40 UNITS IN LACTATED RINGERS INFUSION - SIMPLE MED
62.5000 mL/h | INTRAVENOUS | Status: DC
  Administered 2013-11-21: 62.5 mL/h via INTRAVENOUS
  Filled 2013-11-21: qty 1000

## 2013-11-21 MED ORDER — SENNOSIDES-DOCUSATE SODIUM 8.6-50 MG PO TABS
2.0000 | ORAL_TABLET | ORAL | Status: DC
  Administered 2013-11-22 (×2): 2 via ORAL
  Filled 2013-11-21 (×2): qty 2

## 2013-11-21 MED ORDER — BUTORPHANOL TARTRATE 1 MG/ML IJ SOLN: 1.0000 mg | INTRAMUSCULAR | Status: DC | PRN

## 2013-11-21 MED ORDER — OXYCODONE-ACETAMINOPHEN 5-325 MG PO TABS: 1.0000 | ORAL_TABLET | ORAL | Status: DC | PRN

## 2013-11-21 MED ORDER — SIMETHICONE 80 MG PO CHEW: 80.0000 mg | CHEWABLE_TABLET | ORAL | Status: DC | PRN

## 2013-11-21 MED ORDER — IBUPROFEN 600 MG PO TABS
600.0000 mg | ORAL_TABLET | Freq: Four times a day (QID) | ORAL | Status: DC | PRN
  Filled 2013-11-21: qty 1

## 2013-11-21 MED ORDER — ONDANSETRON HCL 4 MG PO TABS: 4.0000 mg | ORAL_TABLET | ORAL | Status: DC | PRN

## 2013-11-21 MED ORDER — PRENATAL MULTIVITAMIN CH
1.0000 | ORAL_TABLET | Freq: Every day | ORAL | Status: DC
  Administered 2013-11-22 – 2013-11-23 (×2): 1 via ORAL
  Filled 2013-11-21 (×2): qty 1

## 2013-11-21 MED ORDER — BENZOCAINE-MENTHOL 20-0.5 % EX AERO: 1.0000 "application " | INHALATION_SPRAY | CUTANEOUS | Status: DC | PRN

## 2013-11-21 MED ORDER — ZOLPIDEM TARTRATE 5 MG PO TABS: 5.0000 mg | ORAL_TABLET | Freq: Every evening | ORAL | Status: DC | PRN

## 2013-11-21 MED ORDER — OXYTOCIN BOLUS FROM INFUSION
500.0000 mL | INTRAVENOUS | Status: DC
  Administered 2013-11-21: 500 mL via INTRAVENOUS

## 2013-11-21 NOTE — Lactation Note (Signed)
This note was copied from the chart of [name]. Lactation Consultation Note  Patient Name: [name] Today's Date: 11/21/2013 Reason for consult: Initial assessment of this mother/baby dyad at 7 hours postpartum.  Mom is multipara and states she nursed other 3 children for 4-8 weeks each.  She is shown the hand expression technique and LC reviewed benefits of STS, cue feeding and typical newborn feeding behavior, as well as reasons for hand expression, when to expect "milk to come in" and engorgement care.  Mom encouraged to feed baby 8-12 times/24 hours and with feeding cues. LC encouraged review of Baby and Me pp 9, 14 and 20-25 for STS and BF information. LC provided [employer] Resource brochure and reviewed [facility] services and list of community and web site resources.    Maternal Data Formula Feeding for Exclusion: No Infant to breast within first hour of birth: Yes (LATCH score=8 and baby nursed on both breasts for 10-15 minutes each) Has patient been taught Hand Expression?: Yes (LC reviewed technique and reasons for hand expression) Does the patient have breastfeeding experience prior to this delivery?: Yes  Feeding Feeding Type: Breast Fed Length of feed:  (attempted, infant sleepy)  LATCH Score/Interventions         Initial LATCH score=8 after delivery             Lactation Tools Discussed/Used   STS, hand expression, cue feedings, normal newborn feeding behavior  Consult Status Consult Status: Follow-up Date: 11/22/13 Follow-up type: In-patient    Warrick ParisianBryant, Lily Kernen [facility] 11/21/2013, 10:15 PM

## 2013-11-21 NOTE — Anesthesia Procedure Notes (Signed)
Epidural Patient location during procedure: OB  Preanesthetic Checklist Completed: patient identified, site marked, surgical consent, pre-op evaluation, timeout performed, IV checked, risks and benefits discussed and monitors and equipment checked  Epidural Patient position: sitting Prep: site prepped and draped and DuraPrep Patient monitoring: continuous pulse ox and blood pressure Approach: midline Injection technique: LOR air  Needle:  Needle type: Tuohy  Needle gauge: 17 G Needle length: 9 cm and 9 Needle insertion depth: 8 cm Catheter type: closed end flexible Catheter size: 19 Gauge Catheter at skin depth: 15 cm Test dose: negative  Assessment Events: blood not aspirated, injection not painful, no injection resistance, negative IV test and no paresthesia  Additional Notes Dosing of Epidural:  1st dose, through catheter .............................................  Xylocaine 40 mg  2nd dose, through catheter, after waiting 3 minutes.........Xylocaine 60 mg    ( 1% Xylo charted as a single dose in Epic Meds for ease of charting; actual dosing was fractionated as above, for saftey's sake)  As each dose occurred, patient was free of IV sx; and patient exhibited no evidence of SA injection.  Patient is more comfortable after epidural dosed. Please see RN's note for documentation of vital signs,and FHR which are stable.  Patient reminded not to try to ambulate with numb legs, and that an RN must be present when she attempts to get up.       

## 2013-11-21 NOTE — Anesthesia Preprocedure Evaluation (Signed)

## 2013-11-21 NOTE — MAU Note (Signed)
Contractions every 10 minutes for the last hour. Denies LOF and VB.

## 2013-11-21 NOTE — MAU Note (Signed)
Monitors removed to ambulate x 1 hour per Gevena BarreS. Lillard CNM

## 2013-11-21 NOTE — MAU Note (Signed)
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 Not taking suppression meds.

## 2013-11-21 NOTE — H&P (Signed)
Kathy Maynard is a 33 y.o. female presenting  At 39 wks and 3 days in active labor. Pt seen in [place] and was 3 cm on arrival . She walked for 1 hour and changed to 4 cm. She was admitted to Labor and Delivery. SROM of clear fluid at 1319.  Pregnancy complicated gy HSV 2 infection. She has not been taking medication for suppression. Last outbreak was months ago. She denies any currently sores or lesions   Maternal Medical History:  Reason for admission: Contractions.   Contractions: Onset was 6-12 hours ago.   Frequency: regular.   Perceived severity is moderate.    Fetal activity: Perceived fetal activity is normal.   Last perceived fetal movement was within the past hour.      OB History   Grav Para Term Preterm Abortions TAB SAB Ect Mult Living   9 3 3  0 5 4 1  0 0 3     Past Medical History  Diagnosis Date  . Trichimoniasis   . HSV-2 infection   . Medical history non-contributory    Past Surgical History  Procedure Laterality Date  . Vaginal delivery    . Dental surgery     Family History: family history is not on file. Social History:  reports that she has never smoked. She has never used smokeless tobacco. She reports that she does not drink alcohol or use illicit drugs.   Prenatal Transfer Tool  Maternal Diabetes: No Genetic Screening: Normal Maternal Ultrasounds/Referrals: Normal Fetal Ultrasounds or other Referrals:  None Maternal Substance Abuse:  No Significant Maternal Medications:  None Significant Maternal Lab Results:  Lab values include: Group B Strep positive Other Comments:   pt has not taken valtrex for suppresion of HSV although it was prescribed. .. she denies outbreak .. no lesions seen on exam   Review of Systems  All other systems reviewed and are negative.   Dilation: 10 Effacement (%): 100 Station: -3 Exam by:: Dr. Richardson Doppole Blood pressure 105/60, pulse 76, temperature 98.4 F (36.9 C), temperature source Oral, resp. rate 18, height 5\' 7"   (1.702 m), weight 107.956 kg (238 lb), SpO2 94.00%.   Fetal Exam Fetal Monitor Review: Mode: fetoscope.   Baseline rate: 140.  Variability: moderate (6-25 bpm).   Pattern: accelerations present and no decelerations.    Fetal State Assessment: Category I - tracings are normal.     Physical Exam  Vitals reviewed. Constitutional: She is oriented to person, place, and time. She appears well-developed and well-nourished.  HENT:  Head: Normocephalic and atraumatic.  Neck: Normal range of motion.  Cardiovascular: Normal rate and regular rhythm.   Respiratory: Effort normal.  GI: There is no tenderness.  Genitourinary: Vagina normal.  Musculoskeletal: Normal range of motion. She exhibits edema.  Neurological: She is alert and oriented to person, place, and time.  Skin: Skin is warm and dry.  Psychiatric: She has a normal mood and affect.   Pelvic exam no active lesions noted Cervix complete /+1 station Prenatal labs: ABO, Rh: --/--/B POS (07/20 0803) Antibody: NEG (07/20 0803) Rubella: Immune (12/19 0000) RPR: Nonreactive (12/19 0000)  HBsAg: Negative (12/19 0000)  HIV: Non-reactive (05/13 0000)  GBS: Positive (06/26 0000)   Assessment/Plan: 39 wks and 3 days in active labor  gbs positive Penicillin given  Anticipate svd    Godwin Tedesco J. 11/21/2013, 1:57 PM

## 2013-11-22 ENCOUNTER — Encounter (HOSPITAL_COMMUNITY): Payer: BC Managed Care – PPO | Admitting: Anesthesiology

## 2013-11-22 ENCOUNTER — Encounter (HOSPITAL_COMMUNITY)
Admission: AD | Disposition: A | Discharge status: Home / Self Care | Payer: Self-pay | Source: Ambulatory Visit | Attending: Obstetrics and Gynecology

## 2013-11-22 ENCOUNTER — Inpatient Hospital Stay (HOSPITAL_COMMUNITY): Payer: BC Managed Care – PPO | Admitting: Anesthesiology

## 2013-11-22 LAB — CBC
HCT: 28 % — ABNORMAL LOW (ref 36.0–46.0)
Hemoglobin: 9.2 g/dL — ABNORMAL LOW (ref 12.0–15.0)
MCH: 26.6 pg (ref 26.0–34.0)
MCHC: 32.9 g/dL (ref 30.0–36.0)
MCV: 80.9 fL (ref 78.0–100.0)
PLATELETS: 124 10*3/uL — AB (ref 150–400)
RBC: 3.46 MIL/uL — ABNORMAL LOW (ref 3.87–5.11)
RDW: 13.4 % (ref 11.5–15.5)
WBC: 9.6 10*3/uL (ref 4.0–10.5)

## 2013-11-22 LAB — SURGICAL PCR SCREEN
MRSA, PCR: NEGATIVE
STAPHYLOCOCCUS AUREUS: NEGATIVE

## 2013-11-22 SURGERY — LIGATION, FALLOPIAN TUBE, POSTPARTUM
Anesthesia: Epidural | Laterality: Bilateral

## 2013-11-22 MED ORDER — MIDAZOLAM HCL 2 MG/2ML IJ SOLN
INTRAMUSCULAR | Status: AC
  Filled 2013-11-22: qty 2

## 2013-11-22 MED ORDER — FAMOTIDINE 20 MG PO TABS
40.0000 mg | ORAL_TABLET | Freq: Once | ORAL | Status: AC
  Administered 2013-11-22: 40 mg via ORAL
  Filled 2013-11-22: qty 2

## 2013-11-22 MED ORDER — IBUPROFEN 600 MG PO TABS: 600.0000 mg | ORAL_TABLET | Freq: Four times a day (QID) | ORAL | Status: AC

## 2013-11-22 MED ORDER — FENTANYL CITRATE 0.05 MG/ML IJ SOLN
INTRAMUSCULAR | Status: AC
  Filled 2013-11-22: qty 2

## 2013-11-22 MED ORDER — DEXTROSE 5 % IV SOLN
2.0000 g | INTRAVENOUS | Status: DC
  Filled 2013-11-22: qty 2

## 2013-11-22 MED ORDER — METOCLOPRAMIDE HCL 10 MG PO TABS
10.0000 mg | ORAL_TABLET | Freq: Once | ORAL | Status: AC
  Administered 2013-11-22: 10 mg via ORAL
  Filled 2013-11-22: qty 1

## 2013-11-22 MED ORDER — ONDANSETRON HCL 4 MG/2ML IJ SOLN
INTRAMUSCULAR | Status: AC
  Filled 2013-11-22: qty 2

## 2013-11-22 MED ORDER — SODIUM BICARBONATE 8.4 % IV SOLN
INTRAVENOUS | Status: AC
  Filled 2013-11-22: qty 50

## 2013-11-22 MED ORDER — LACTATED RINGERS IV SOLN
INTRAVENOUS | Status: DC
  Administered 2013-11-22: 13:00:00 via INTRAVENOUS

## 2013-11-22 MED ORDER — FERROUS SULFATE 325 (65 FE) MG PO TABS: 325.0000 mg | ORAL_TABLET | Freq: Two times a day (BID) | ORAL | Status: DC

## 2013-11-22 MED ORDER — LIDOCAINE-EPINEPHRINE (PF) 2 %-1:200000 IJ SOLN
INTRAMUSCULAR | Status: AC
  Filled 2013-11-22: qty 20

## 2013-11-22 SURGICAL SUPPLY — 28 items
ADH SKN CLS APL DERMABOND .7 (GAUZE/BANDAGES/DRESSINGS)
CLOTH BEACON ORANGE TIMEOUT ST (SAFETY) ×1 IMPLANT
CONTAINER PREFILL 10% NBF 15ML (MISCELLANEOUS) ×2 IMPLANT
DERMABOND ADVANCED (GAUZE/BANDAGES/DRESSINGS)
DERMABOND ADVANCED .7 DNX12 (GAUZE/BANDAGES/DRESSINGS) ×1 IMPLANT
DRSG OPSITE POSTOP 3X4 (GAUZE/BANDAGES/DRESSINGS) ×1 IMPLANT
ELECT REM PT RETURN 9FT ADLT (ELECTROSURGICAL)
ELECTRODE REM PT RTRN 9FT ADLT (ELECTROSURGICAL) ×1 IMPLANT
GLOVE BIOGEL M 6.5 STRL (GLOVE) ×3 IMPLANT
GLOVE BIOGEL PI IND STRL 6.5 (GLOVE) ×1 IMPLANT
GLOVE BIOGEL PI INDICATOR 6.5 (GLOVE)
GLOVE ECLIPSE 6.5 STRL STRAW (GLOVE) ×1 IMPLANT
GOWN STRL REUS W/TWL LRG LVL3 (GOWN DISPOSABLE) ×2 IMPLANT
NDL HYPO 25X1 1.5 SAFETY (NEEDLE) ×1 IMPLANT
NEEDLE HYPO 25X1 1.5 SAFETY (NEEDLE) IMPLANT
NS IRRIG 1000ML POUR BTL (IV SOLUTION) ×1 IMPLANT
PACK ABDOMINAL MINOR (CUSTOM PROCEDURE TRAY) ×1 IMPLANT
PENCIL BUTTON HOLSTER BLD 10FT (ELECTRODE) ×1 IMPLANT
SPONGE LAP 4X18 X RAY DECT (DISPOSABLE) IMPLANT
SUT VIC AB 0 CT1 18XCR BRD8 (SUTURE) ×1 IMPLANT
SUT VIC AB 0 CT1 8-18 (SUTURE)
SUT VIC AB 0 CT2 27 (SUTURE) IMPLANT
SUT VICRYL 0 UR6 27IN ABS (SUTURE) ×1 IMPLANT
SUT VICRYL 4-0 PS2 18IN ABS (SUTURE) ×1 IMPLANT
SYR CONTROL 10ML LL (SYRINGE) ×1 IMPLANT
TOWEL OR 17X24 6PK STRL BLUE (TOWEL DISPOSABLE) ×2 IMPLANT
TRAY FOLEY CATH 14FR (SET/KITS/TRAYS/PACK) ×1 IMPLANT
WATER STERILE IRR 1000ML POUR (IV SOLUTION) ×1 IMPLANT

## 2013-11-22 NOTE — Progress Notes (Signed)
Postpartum Note Day # 1  S:  Patient resting comfortable in bed.  Pain controlled.  Tolerating general. Lochia appropriate.  Ambulating without difficulty.  She denies n/v/f/c, SOB, or CP.  Pt plans on breastfeeding.  O: Temp:  [98.2 F (36.8 C)-98.6 F (37 C)] 98.6 F (37 C) (07/21 0534) Pulse Rate:  [63-110] 65 (07/21 0534) Resp:  [18-20] 20 (07/21 0534) BP: (92-160)/(47-124) 124/84 mmHg (07/21 0534) SpO2:  [89 %-99 %] 94 % (07/20 1125) Gen: A&Ox3, NAD CV: RRR, no MRG Resp: CTAB Abdomen: soft, NT, ND Uterus: firm, non-tender, below umbilicus Ext: No edema, no calf tenderness bilaterally  Labs:  Recent Labs  11/21/13 1913 11/22/13 0605  HGB 9.7* 9.2*    A/P: Pt is a 33 y.o. [id number] s/p NSVD PPD#1  - Pain well controlled -GU: UOP is adequate -GI: Tolerating general diet -Prophylaxis: encourage early ambulation as tolerated -Labs: stable as above, pt to continue on iron daily -Plan for postpartum tubal ligation today @ 1700:  Bilateral tubal ligation reviewed with R&B including but not limited to bleeding, infection, injury to other organs, irreversibility and failure rate of 05/998. Questions and concerns answered.  Pt to be NPO @ 9am today.   Myna HidalgoJennifer Orrie Lascano, DO [phone] (pager) [phone] (office)

## 2013-11-22 NOTE — Anesthesia Preprocedure Evaluation (Deleted)
Anesthesia Evaluation  Patient identified by MRN, date of birth, ID band Patient awake    Reviewed: Allergy & Precautions, H&P , Patient's Chart, lab work & pertinent test results  Airway Mallampati: II TM Distance: >3 FB Neck ROM: full    Dental  (+) Teeth Intact   Pulmonary  breath sounds clear to auscultation        Cardiovascular Rhythm:regular Rate:Normal     Neuro/Psych    GI/Hepatic   Endo/Other    Renal/GU      Musculoskeletal   Abdominal   Peds  Hematology   Anesthesia Other Findings    Reproductive/Obstetrics                          Anesthesia Physical  Anesthesia Plan  ASA: III  Anesthesia Plan: Epidural   Post-op Pain Management:    Induction:   Airway Management Planned:   Additional Equipment:   Intra-op Plan:   Post-operative Plan:   Informed Consent: I have reviewed the patients History and Physical, chart, labs and discussed the procedure including the risks, benefits and alternatives for the proposed anesthesia with the patient or authorized representative who has indicated his/her understanding and acceptance.   Dental Advisory Given  Plan Discussed with:   Anesthesia Plan Comments: (Labs checked- platelets confirmed with RN in room. Fetal heart tracing, per RN, reported to be stable enough for sitting procedure. Discussed epidural, and patient consents to the procedure:  included risk of possible headache,backache, failed block, allergic reaction, and nerve injury. This patient was asked if she had any questions or concerns before the procedure started.)       patient cancelled  Anesthesia Quick Evaluation

## 2013-11-22 NOTE — Progress Notes (Signed)
Patient called nurse into the room and stated that she no longer wanted to do the tubal ligation today. She stated that she was hungry and wanted her IV out. I asked patient if there was anything that I could do to make her more comfortable and she stated "no". Dr. Charlotta Newtonzan was notified and came to speak with patient and she still decided against the procedure.

## 2013-11-22 NOTE — Lactation Note (Signed)
This note was copied from the chart of [name]. Lactation Consultation Note  Patient Name: Girl Kathy SodaCrystal Sanjuan ZOXWR'UToday's Date: 11/22/2013 Reason for consult: Follow-up assessment Baby 27 hours of life. Mom reports breastfeeding going very well. Mom has experience breastfeeding 2 of her 3 older children. Mom states that nurse helped her earlier to get a deeper latch. Mom is eating and baby is sleeping and mom does not want to latch baby at this time. Enc mom to undress baby and offer lots of STS and the breast with cues--at least 8-12 times/24 hours. Mom states that she is able to hand express colostrum from both breasts and she hears swallows when baby nurses. Enc mom to call out for assistance as needed and to see a latch.  Maternal Data    Feeding Feeding Type:  (Baby sleeping, mom eating, doesn't want to latch right now.) Length of feed: 20 min  LATCH Score/Interventions                      Lactation Tools Discussed/Used     Consult Status Consult Status: Follow-up Date: 11/23/13 Follow-up type: In-patient    Geralynn OchsWILLIARD, Vestal Crandall 11/22/2013, 5:43 PM

## 2013-11-22 NOTE — Discharge Instructions (Signed)
Vaginal Delivery °Care After °Refer to this sheet in the next few weeks. These discharge instructions provide you with information on caring for yourself after delivery. Your caregiver may also give you specific instructions. Your treatment has been planned according to the most current medical practices available, but problems sometimes occur. Call your caregiver if you have any problems or questions after you go home. °HOME CARE INSTRUCTIONS °· Take over-the-counter or prescription medicines only as directed by your caregiver or pharmacist. °· Do not drink alcohol, especially if you are breastfeeding or taking medicine to relieve pain. °· Do not chew or smoke tobacco. °· Do not use illegal drugs. °· Continue to use good perineal care. Good perineal care includes: °¨ Wiping your perineum from front to back. °¨ Keeping your perineum clean. °· Do not use tampons or douche until your caregiver says it is okay. °· Shower, wash your hair, and take tub baths as directed by your caregiver. °· Wear a well-fitting bra that provides breast support. °· Eat healthy foods. °· Drink enough fluids to keep your urine clear or pale yellow. °· Eat high-fiber foods such as whole grain cereals and breads, brown rice, beans, and fresh fruits and vegetables every day. These foods may help prevent or relieve constipation. °· Follow your cargiver's recommendations regarding resumption of activities such as climbing stairs, driving, lifting, exercising, or traveling. °· Talk to your caregiver about resuming sexual activities. Resumption of sexual activities is dependent upon your risk of infection, your rate of healing, and your comfort and desire to resume sexual activity. °· Try to have someone help you with your household activities and your newborn for at least a few days after you leave the hospital. °· Rest as much as possible. Try to rest or take a nap when your newborn is sleeping. °· Increase your activities gradually. °· Keep all  of your scheduled postpartum appointments. It is very important to keep your scheduled follow-up appointments. At these appointments, your caregiver will be checking to make sure that you are healing physically and emotionally. °SEEK MEDICAL CARE IF:  °· You are passing large clots from your vagina. Save any clots to show your caregiver. °· You have a foul smelling discharge from your vagina. °· You have trouble urinating. °· You are urinating frequently. °· You have pain when you urinate. °· You have a change in your bowel movements. °· You have increasing redness, pain, or swelling near your vaginal incision (episiotomy) or vaginal tear. °· You have pus draining from your episiotomy or vaginal tear. °· Your episiotomy or vaginal tear is separating. °· You have painful, hard, or reddened breasts. °· You have a severe headache. °· You have blurred vision or see spots. °· You feel sad or depressed. °· You have thoughts of hurting yourself or your newborn. °· You have questions about your care, the care of your newborn, or medicines. °· You are dizzy or lightheaded. °· You have a rash. °· You have nausea or vomiting. °· You were breastfeeding and have not had a menstrual period within 12 weeks after you stopped breastfeeding. °· You are not breastfeeding and have not had a menstrual period by the 12th week after delivery. °· You have a fever. °SEEK IMMEDIATE MEDICAL CARE IF:  °· You have persistent pain. °· You have chest pain. °· You have shortness of breath. °· You faint. °· You have leg pain. °· You have stomach pain. °· Your vaginal bleeding saturates two or more sanitary pads   in 1 hour. °MAKE SURE YOU:  °· Understand these instructions. °· Will watch your condition. °· Will get help right away if you are not doing well or get worse. ° ° °Document Released: 04/18/2000 Document Revised: 01/14/2012 Document Reviewed: 12/17/2011 °[employer]® Patient Information ©2015 [employer]. This information is not intended to  replace advice given to you by your health care provider. Make sure you discuss any questions you have with your health care provider. ° °

## 2013-11-22 NOTE — Anesthesia Postprocedure Evaluation (Signed)
  Anesthesia Post-op Note  Patient: Kathy Maynard  Procedure(s) Performed: * No procedures listed *  Patient Location: PACU and Mother/Baby  Anesthesia Type:Bier block  Level of Consciousness: awake  Airway and Oxygen Therapy: Patient Spontanous Breathing  Post-op Pain: none  Post-op Assessment: Patient's Cardiovascular Status Stable, Respiratory Function Stable, Patent Airway, No signs of Nausea or vomiting, Adequate PO intake, Pain level controlled, No headache, No backache, No residual numbness and No residual motor weakness  Post-op Vital Signs: Reviewed and stable  Last Vitals:  Filed Vitals:   11/22/13 0534  BP: 124/84  Pulse: 65  Temp: 37 C  Resp: 20    Complications: No apparent anesthesia complications and Patient re-intubated

## 2013-11-22 NOTE — Progress Notes (Signed)
Pt has decided against a tubal ligation at this time.  They would like to further look into a vasectomy and are not ready to make a final decision.  Will change diet to regular diet, ok to remove IV and epidural catheter.  Myna HidalgoJennifer Valiant Dills, DO [phone]) [phone] (office)

## 2013-11-23 ENCOUNTER — Inpatient Hospital Stay (HOSPITAL_COMMUNITY): Admission: RE | Admit: 2013-11-23 | Payer: 59 | Source: Ambulatory Visit

## 2013-11-23 MED ORDER — FERROUS SULFATE 325 (65 FE) MG PO TABS: 325.0000 mg | ORAL_TABLET | Freq: Every day | ORAL | Status: AC

## 2013-11-23 NOTE — Lactation Note (Signed)
This note was copied from the chart of Kathy [name] Nou. Lactation Consultation Note: Mom called to assist with latch. Baby sucking her tongue and not opening wide. Assisted with latch. Baby took a few attempts then latched well. Mom reports that breasts are feeling fuller this morning and feels softer after baby nurses. Nursing on second breast when I left room. No further questions at present. To call prn  Patient Name: Kathy Maynard Reason for consult: Follow-up assessment   Maternal Data Formula Feeding for Exclusion: No Infant to breast within first hour of birth: Yes Has patient been taught Hand Expression?: Yes Does the patient have breastfeeding experience prior to this delivery?: Yes  Feeding Feeding Type: Breast Fed Length of feed: 0 min (too sleepy)  LATCH Score/Interventions Latch: Grasps breast easily, tongue down, lips flanged, rhythmical sucking.  Audible Swallowing: A few with stimulation  Type of Nipple: Everted at rest and after stimulation  Comfort (Breast/Nipple): Soft / non-tender     Hold (Positioning): Assistance needed to correctly position infant at breast and maintain latch.  LATCH Score: 8  Lactation Tools Discussed/Used     Consult Status Consult Status: Complete    Pamelia HoitWeeks, Sharniece Gibbon D Maynard, 10:24 AM

## 2013-11-23 NOTE — Progress Notes (Signed)
Postpartum Note Day # 2  S:  Patient resting comfortable in bed.  Pain controlled.  Tolerating general. Lochia appropriate and per patient has started to slow down.  Ambulating without difficulty.  +flatus, +BM.  She denies n/v/f/c, SOB, or CP.  Pt plans on breastfeeding.  O: Temp:  [97.9 F (36.6 C)-98 F (36.7 C)] 97.9 F (36.6 C) (07/22 0610) Pulse Rate:  [68-74] 68 (07/22 0610) Resp:  [18-20] 18 (07/22 0610) BP: (120-143)/(65-90) 120/65 mmHg (07/22 0610) Gen: A&Ox3, NAD CV: RRR, no MRG Resp: CTAB Abdomen: soft, NT, ND Uterus: firm, non-tender, below umbilicus Ext: Minimal non-pitting edema, no calf tenderness bilaterally  Labs:   Recent Labs  11/21/13 1913 11/22/13 0605  HGB 9.7* 9.2*    A/P: Pt is a 33 y.o. [id number] s/p NSVD PPD#1  - Pain well controlled -GU: UOP is adequate -GI: Tolerating general diet -Prophylaxis: encourage early ambulation as tolerated -Labs: stable as above, pt to continue on iron daily -Pt meeting postpartum milestones appropriately, plan for discharge home today   Myna HidalgoJennifer Trudee Chirino, DO [phone] (pager) [phone] (office)

## 2013-11-23 NOTE — Progress Notes (Signed)
Patient: Kathy Maynard,Kathy Maynard Kathy Maynard Admit Date: 11/21/2013  Marjo Bickerhilds Name:  Kathy Maynard Kathy Sodarystal, Maynard   Clinical Social Worker: Kathy Maynard, [place] Date/Time: 11/23/2013 12:00 M  Date Referred: 11/23/2013  Referred reason   Psychosocial assessment   Other referral source:  I: FAMILY / HOME ENVIRONMENT  Child's legal guardian: PARENT  Guardian - Name  Guardian - Age  Guardian - Address   Kathy Maynard   [address] [place] [address]   Other household support members/support persons  Other support:  II PSYCHOSOCIAL DATA  Information Source: Family Interview  Event organiserinancial and Community Resources  Employment:  Mother works at [employer]   Financial resources: [employer]  If [employer]  School / Grade:  Maternity Care Coordinator / Child Services Coordination / Early Interventions: Cultural issues impacting care:  III STRENGTHS  Strengths   Supportive family/friends   Strength comment:  IV RISK FACTORS AND CURRENT PROBLEMS  Current Problem: YES  Risk Factor & Current Problem  Patient Issue  Family Issue  Risk Factor / Current Problem Comment   Mental Illness  N  Y  mother hx of depression    N  N    V SOCIAL WORK ASSESSMENT  CSW consulted to see this family following mother's report that other children are not in her custody. CSW introduced self and role of CSW. Mother at first reluctant but then spoke easily with CSW. Mother lives alone. Works for [employer] and has been at this job for past two years. Has maternity leave scheduled from work. Cites "work family" as best source of support as no family locally. Mother and step father live in [place], brother lives in [place]. Mother states she told physician that her other children were in custody of her mother in [place] because "it's painful and I don't like everybody to know my business." Mother reports that she has 3 other children, [place] live in [place] in different  homes. 32 year old lives with his father. Younger son lives with his grandfather and 32 year old daughter is in foster care. Mother reports that she has no contact as she surrendered her rights voluntarily in 2010. Mother reports that she had been homeless for a time and was struggling with depression related to her living circumstances. Mother reports decision was painful, but she believed best for her children . Mother states she is doing well now. Has been in same job for 2 years and has good support. Mother credits her faith for getting her through. Mother states FOB is not involved. States that she feels well prepared for this baby and has everything she needs. Discussed signs/symptoms of PPD, particularly in light of mother's past history. Mother with very limited knowledge about PPD and was receptive to information presented by CSW. Mother agreeable to speak with her provider if any depressive symptoms present. No needs expressed at this time.   VI SOCIAL WORK PLAN  Social Work Plan   No Further Intervention Required / No Barriers to Discharge   Kathy Maynard, [place] [phone]

## 2013-11-23 NOTE — Lactation Note (Signed)
This note was copied from the chart of Kathy Maynard. Lactation Consultation Note: Follow up visit with this experienced BF mom before DC. Baby asleep on mom's chest at present. Mom reports that baby has been nursing well. Reports lots of cramping while baby is nursing. Reassurance given. Plans to get pump from insurance company. No further questions at present. To call prn  Patient Name: Kathy Maynard ZOXWR'UToday's Date: 11/23/2013 Reason for consult: Follow-up assessment   Maternal Data Formula Feeding for Exclusion: No Infant to breast within first hour of birth: Yes Has patient been taught Hand Expression?: Yes Does the patient have breastfeeding experience prior to this delivery?: Yes  Feeding Feeding Type: Breast Fed Length of feed: 20 min  LATCH Score/Interventions                      Lactation Tools Discussed/Used     Consult Status Consult Status: Complete    Pamelia HoitWeeks, Tariyah Pendry D 11/23/2013, 8:43 AM

## 2013-11-24 NOTE — Discharge Summary (Signed)
Obstetric Discharge Summary Reason for Admission: onset of labor Prenatal Procedures: none Intrapartum Procedures: spontaneous vaginal delivery Postpartum Procedures: none Complications-Operative and Postpartum: none Hemoglobin  Date Value Ref Range Status  11/22/2013 9.2* 12.0 - 15.0 g/dL Final     HCT  Date Value Ref Range Status  11/22/2013 28.0* 36.0 - 46.0 % Final    Physical Exam:  Gen: A&Ox3, NAD  CV: RRR, no MRG  Resp: CTAB  Abdomen: soft, NT, ND  Uterus: firm, non-tender, below umbilicus  Ext: Minimal non-pitting edema, no calf tenderness bilaterally  Discharge Diagnoses: Term Pregnancy-delivered  Hospital Course: Kathy Maynard is a 33 y.o. female presenting At 39 wks and 3 days in active labor. Pt was initially 3cm on admitted and was noted to progress to 4cm.  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 Last outbreak was months ago. She denies any currently sores or lesions.   2) GBS positive, pt to receive PCN in labor  Patient received an epidural for pain management progressed expectantly with SROM to complete dilation.  She had an uncomplicated vaginal delivery performed by Dr. Richardson Doppole.  For further information, please see the delivery summary.  Her postpartum course was uncomplicated and she was discharged home in stable condition on PPD#2.  Discharge Information: Date: 11/24/2013 Activity: pelvic rest Diet: routine Medications: PNV, Ibuprofen, Colace and Iron Condition: stable Instructions: refer to practice specific booklet Discharge to: home Follow-up Information   Follow up with Kathy Maynard, Kathy Maynard, Kathy Maynard, Kathy Maynard In 6 weeks.   Specialty:  Obstetrics and Gynecology   Contact information:   [address] [place] [address] [phone]       Newborn Data: Live born female  Birth Weight: 6 lb 6.8 oz (2915 g) APGAR: 8, 9  Home with mother.  Kathy Maynard, Kathy Maynard, Kathy Maynard 11/24/2013, 1:53 PM

## 2014-03-06 ENCOUNTER — Encounter (HOSPITAL_COMMUNITY): Payer: Self-pay | Admitting: *Deleted

## 2014-05-18 ENCOUNTER — Encounter (HOSPITAL_COMMUNITY): Payer: Self-pay | Admitting: Obstetrics & Gynecology

## 2014-10-12 ENCOUNTER — Encounter (HOSPITAL_COMMUNITY): Payer: Self-pay | Admitting: Obstetrics & Gynecology
